# Patient Record
Sex: Female | Born: 1990 | Race: White | Hispanic: No | Marital: Single | Smoking: Never smoker
Health system: Southern US, Community
[De-identification: ages and names within clinical notes are randomized; demographics above are authoritative.]

## PROBLEM LIST (undated history)

## (undated) DIAGNOSIS — K219 Gastro-esophageal reflux disease without esophagitis: Secondary | ICD-10-CM

## (undated) DIAGNOSIS — J45909 Unspecified asthma, uncomplicated: Secondary | ICD-10-CM

---
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
## 2010-08-18 NOTE — Op Note (Signed)
[name], Teresa Bennett           ACCOUNT NO.:  [id number]   MEDICAL RECORD NO.:  [id number]          PATIENT TYPE:  AMB   LOCATION:  SDS                          FACILITY:  [facility]   PHYSICIAN:  Jon Gills, M.D.  DATE OF BIRTH:  Nov 28, 1990   DATE OF PROCEDURE:  DATE OF DISCHARGE:  08/08/2007                               OPERATIVE REPORT   PREOPERATIVE DIAGNOSIS:  Chest pain, undetermined cause.   POSTOPERATIVE DIAGNOSIS:  Chest pain, undetermined cause.   NAME OF OPERATION OR PROCEDURE:  Upper GI endoscopy with biopsy.   SURGEON:  Jon Gills, MD, Pediatric.   ASSISTANTS:  None.   DESCRIPTION OF FINDINGS:  Following informed and written consent, the  patient was taken to the operating room and placed under general  anesthesia with continuous cardiopulmonary monitoring.  She remained in  the supine position and the Pentax upper GI endoscope was passed by  mouth and advanced without difficulty.  A competent lower esophageal  sphincter was present 38 cm from the incisors.  There was no visual  evidence for gastritis, duodenitis, or peptic ulcer disease, although  linear erosions were seen in the distal esophagus.  A solitary gastric  biopsy was negative for Helicobacter by CLO testing.  Multiple  esophageal, gastric, and duodenal biopsies were histologically normal.  The endoscope was gradually withdrawn and the patient was taken to  recovery room in satisfactory condition.  She will be released later  today to the care of her family.   DESCRIPTION AND TECHNICAL PROCEDURE:  She used Pentax upper  gastrointestinal endoscope with cold biopsy forceps.   DESCRIPTION SPECIMENS REMOVED:  Esophagus x3 in formalin, gastric x1 for  CLO testing, gastric x3 in formalin, and duodenum x3 in formalin.           ______________________________  Jon Gills, M.D.     JHC/MEDQ  D:  08/28/2007  T:  08/29/2007  Job:  [id number]   cc:   Gildardo Pounds, M.D.

## 2011-03-24 IMAGING — CR CERVICAL SPINE - COMPLETE 4+ VIEW
1 series · 6 of 6 positions shown · non-contrast
Comparison: None

REASON FOR EXAM: LUE paresthesia s/p fall
COMMENTS:

PROCEDURE:     DXR - DXR CERVICAL SPINE COMPLETE  - September 20, 2009  [DATE]
RESULT:     History: Left upper extremity paresthesia

[Series 1: view not recorded · 0.17mm/px · 6 of 6 slices shown]
[im 1/6]
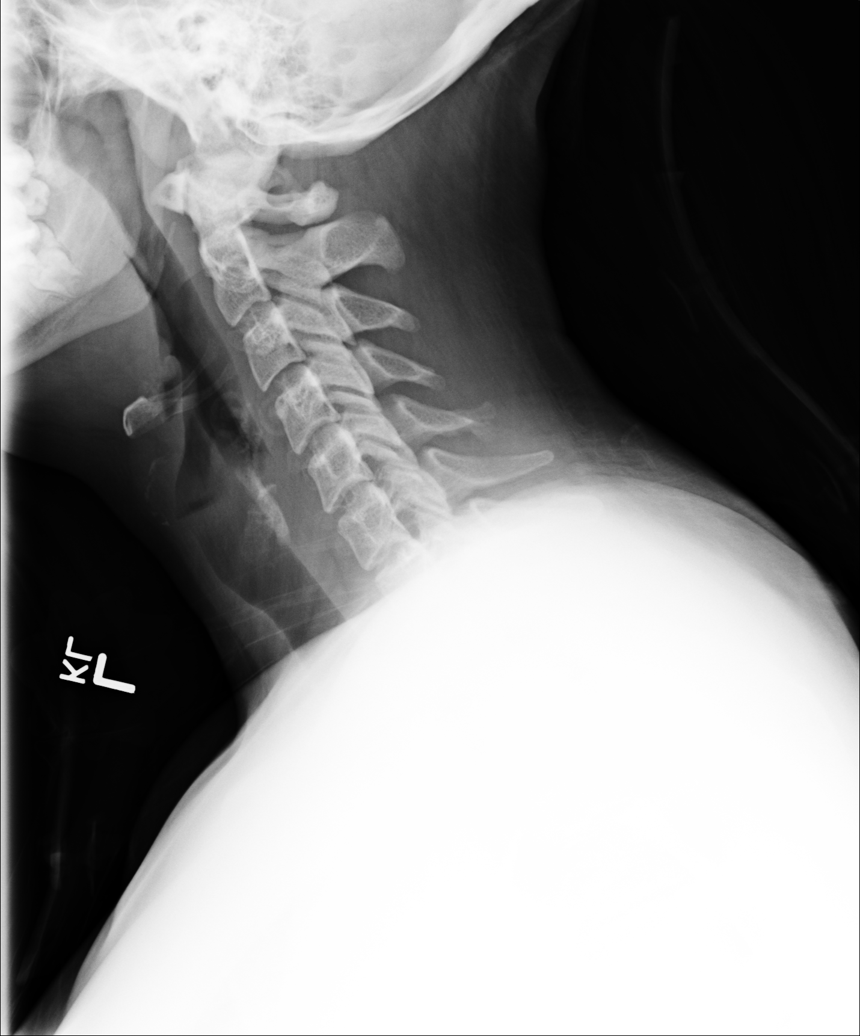
[im 2/6]
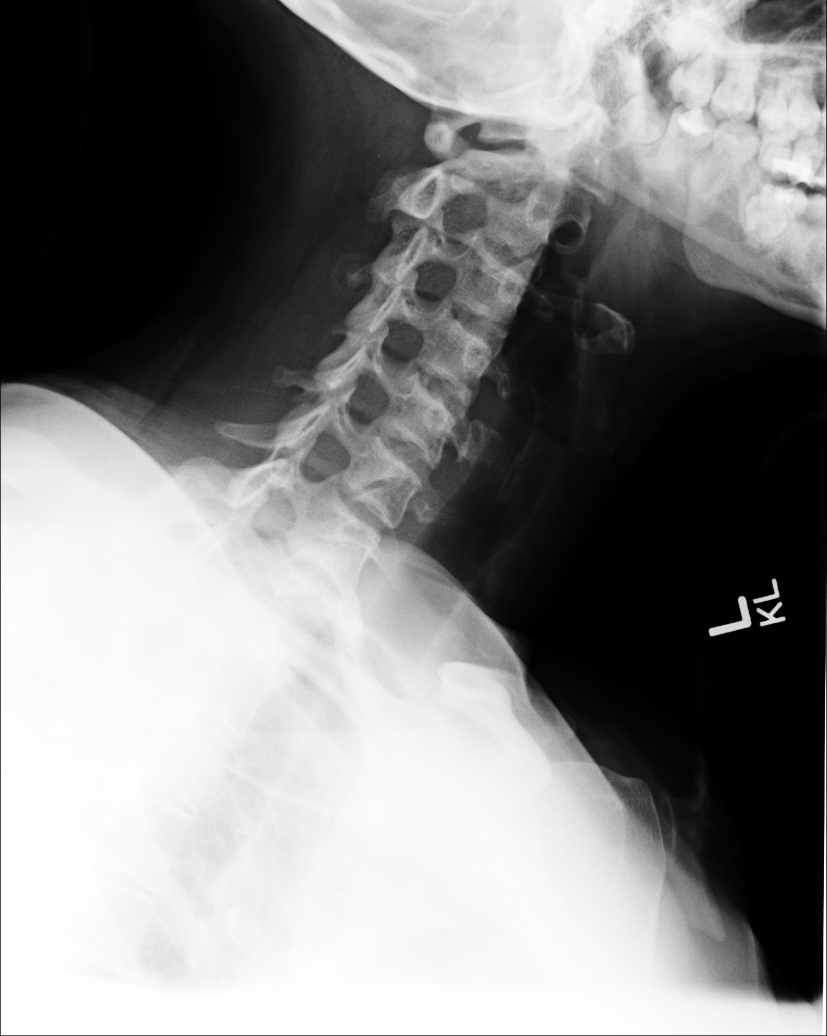
[im 3/6]
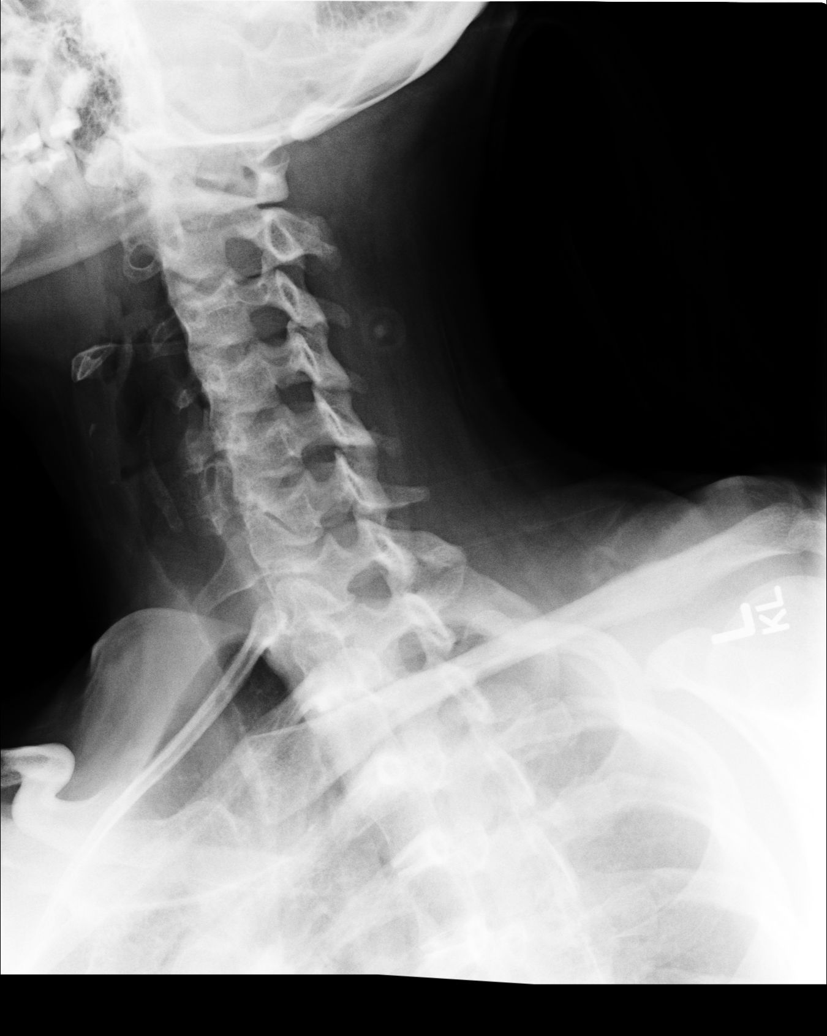
[im 4/6]
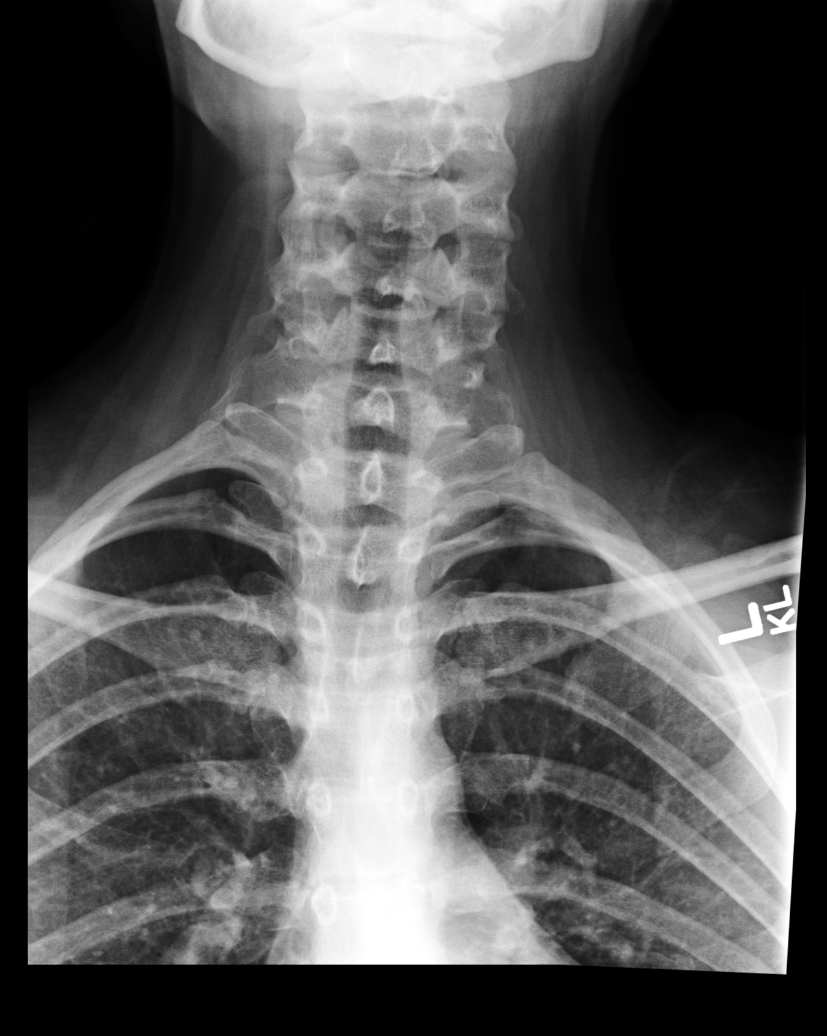
[im 5/6]
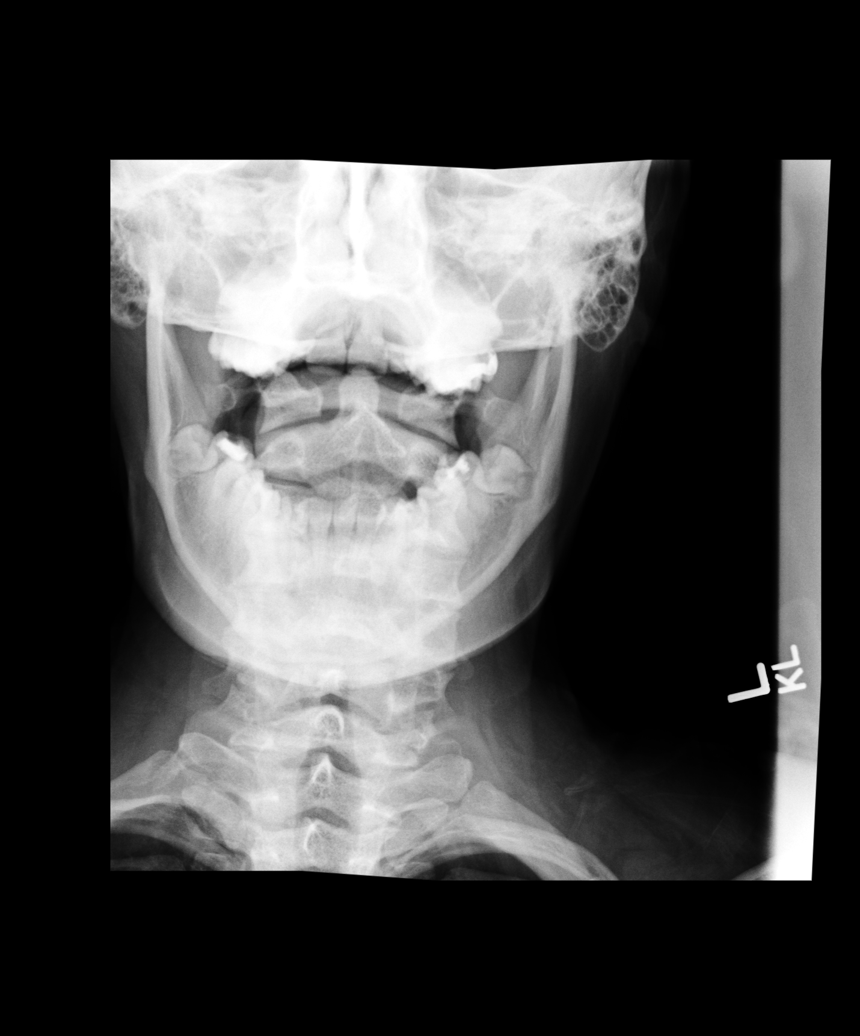
[im 6/6]
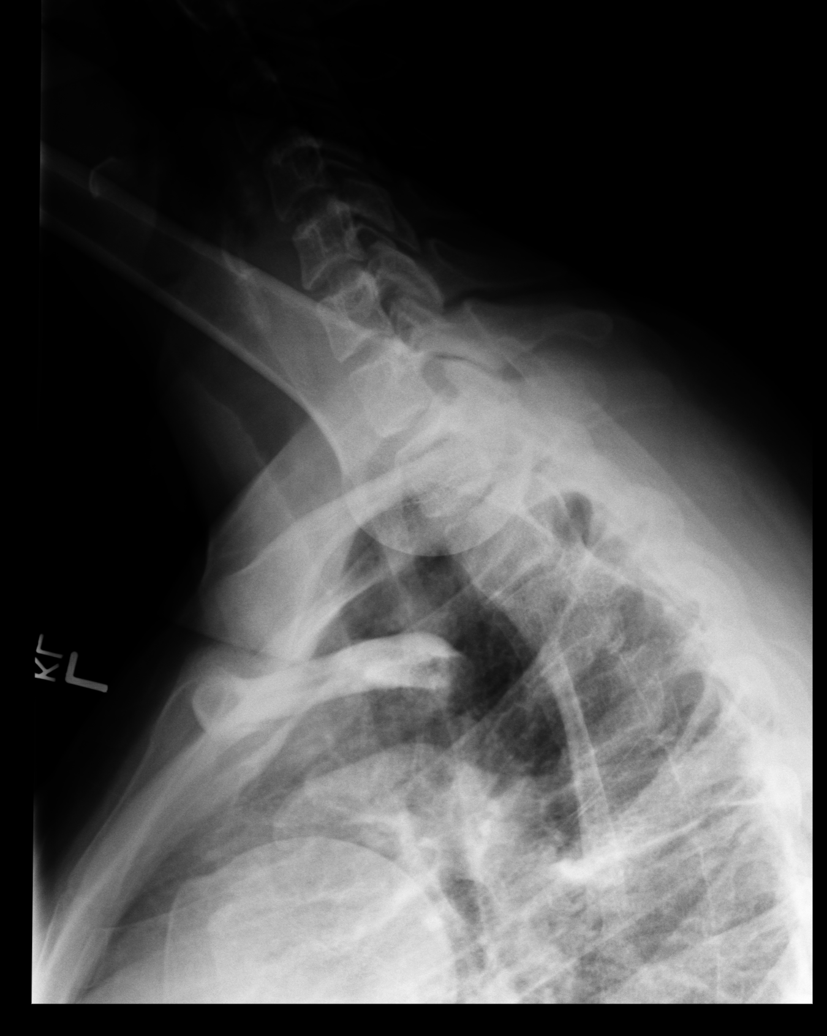

[6 of 6 positions shown; findings below may reference images not displayed]

FINDINGS: AP, lateral, odontoid and bilateral oblique views of the cervical spine are
provided.

The cervical spine is visualized to the level of C7-T1.

The vertebral body heights are maintained. The alignment is normal. The
prevertebral soft tissues are normal. There is no acute fracture or static
listhesis.
IMPRESSION: No acute osseous injury of the cervical spine.

## 2013-09-27 ENCOUNTER — Observation Stay: Payer: Self-pay | Admitting: Obstetrics and Gynecology

## 2013-09-27 LAB — URINALYSIS, COMPLETE
BLOOD: NEGATIVE
Bilirubin,UR: NEGATIVE
GLUCOSE, UR: NEGATIVE mg/dL (ref 0–75)
KETONE: NEGATIVE
LEUKOCYTE ESTERASE: NEGATIVE
NITRITE: NEGATIVE
PH: 6 (ref 4.5–8.0)
PROTEIN: NEGATIVE
RBC, UR: NONE SEEN /HPF (ref 0–5)
Specific Gravity: 1.012 (ref 1.003–1.030)
Squamous Epithelial: 2

## 2015-05-03 ENCOUNTER — Emergency Department
Admission: EM | Admit: 2015-05-03 | Discharge: 2015-05-03 | Disposition: A | Discharge status: Home / Self Care | Attending: Student | Admitting: Student

## 2015-05-03 ENCOUNTER — Encounter: Payer: Self-pay | Admitting: Emergency Medicine

## 2015-05-03 DIAGNOSIS — R109 Unspecified abdominal pain: Secondary | ICD-10-CM | POA: Diagnosis not present

## 2015-05-03 DIAGNOSIS — R112 Nausea with vomiting, unspecified: Secondary | ICD-10-CM | POA: Insufficient documentation

## 2015-05-03 DIAGNOSIS — R197 Diarrhea, unspecified: Secondary | ICD-10-CM | POA: Insufficient documentation

## 2015-05-03 DIAGNOSIS — Z3202 Encounter for pregnancy test, result negative: Secondary | ICD-10-CM | POA: Insufficient documentation

## 2015-05-03 DIAGNOSIS — R000 Tachycardia, unspecified: Secondary | ICD-10-CM | POA: Diagnosis not present

## 2015-05-03 DIAGNOSIS — R1110 Vomiting, unspecified: Secondary | ICD-10-CM

## 2015-05-03 HISTORY — DX: Gastro-esophageal reflux disease without esophagitis: K21.9

## 2015-05-03 LAB — CBC WITH DIFFERENTIAL/PLATELET
BASOS ABS: 0 10*3/uL (ref 0–0.1)
BASOS PCT: 0 %
EOS PCT: 0 %
Eosinophils Absolute: 0 10*3/uL (ref 0–0.7)
HEMATOCRIT: 48.3 % — AB (ref 35.0–47.0)
Hemoglobin: 16.5 g/dL — ABNORMAL HIGH (ref 12.0–16.0)
LYMPHS PCT: 5 %
Lymphs Abs: 0.4 10*3/uL — ABNORMAL LOW (ref 1.0–3.6)
MCH: 30 pg (ref 26.0–34.0)
MCHC: 34.2 g/dL (ref 32.0–36.0)
MCV: 87.8 fL (ref 80.0–100.0)
MONO ABS: 0.3 10*3/uL (ref 0.2–0.9)
Monocytes Relative: 3 %
NEUTROS ABS: 7.5 10*3/uL — AB (ref 1.4–6.5)
Neutrophils Relative %: 92 %
PLATELETS: 165 10*3/uL (ref 150–440)
RBC: 5.5 MIL/uL — AB (ref 3.80–5.20)
RDW: 13.1 % (ref 11.5–14.5)
WBC: 8.2 10*3/uL (ref 3.6–11.0)

## 2015-05-03 LAB — URINALYSIS COMPLETE WITH MICROSCOPIC (ARMC ONLY)
BACTERIA UA: NONE SEEN
BILIRUBIN URINE: NEGATIVE
Glucose, UA: NEGATIVE mg/dL
HGB URINE DIPSTICK: NEGATIVE
Ketones, ur: NEGATIVE mg/dL
LEUKOCYTES UA: NEGATIVE
Nitrite: NEGATIVE
Protein, ur: 30 mg/dL — AB
SPECIFIC GRAVITY, URINE: 1.031 — AB (ref 1.005–1.030)
pH: 6 (ref 5.0–8.0)

## 2015-05-03 LAB — COMPREHENSIVE METABOLIC PANEL
ALBUMIN: 5 g/dL (ref 3.5–5.0)
ALT: 34 U/L (ref 14–54)
AST: 22 U/L (ref 15–41)
Alkaline Phosphatase: 45 U/L (ref 38–126)
Anion gap: 9 (ref 5–15)
BUN: 24 mg/dL — AB (ref 6–20)
CHLORIDE: 103 mmol/L (ref 101–111)
CO2: 25 mmol/L (ref 22–32)
CREATININE: 0.84 mg/dL (ref 0.44–1.00)
Calcium: 8.7 mg/dL — ABNORMAL LOW (ref 8.9–10.3)
GFR calc Af Amer: >60 mL/min (ref >60)
GFR calc non Af Amer: >60 mL/min (ref >60)
GLUCOSE: 153 mg/dL — AB (ref 65–99)
POTASSIUM: 3.9 mmol/L (ref 3.5–5.1)
Sodium: 137 mmol/L (ref 135–145)
Total Bilirubin: 1 mg/dL (ref 0.3–1.2)
Total Protein: 8.2 g/dL — ABNORMAL HIGH (ref 6.5–8.1)

## 2015-05-03 LAB — LIPASE, BLOOD: Lipase: 19 U/L (ref 11–51)

## 2015-05-03 LAB — POCT PREGNANCY, URINE: Preg Test, Ur: NEGATIVE

## 2015-05-03 MED ORDER — ONDANSETRON HCL 4 MG/2ML IJ SOLN
4.0000 mg | Freq: Once | INTRAMUSCULAR | Status: AC
  Administered 2015-05-03: 4 mg via INTRAVENOUS
  Filled 2015-05-03: qty 2

## 2015-05-03 MED ORDER — ONDANSETRON 4 MG PO TBDP
4.0000 mg | ORAL_TABLET | Freq: Three times a day (TID) | ORAL | Status: DC | PRN
Start: 2015-05-03 — End: 2020-03-18

## 2015-05-03 MED ORDER — SODIUM CHLORIDE 0.9 % IV BOLUS (SEPSIS)
1000.0000 mL | Freq: Once | INTRAVENOUS | Status: AC
  Administered 2015-05-03: 1000 mL via INTRAVENOUS

## 2015-05-03 NOTE — ED Notes (Signed)
Pt to ed with c/o vomiting x 12 over the last 24 hours.  Pt reports one episode of diarrhea.

## 2015-05-03 NOTE — ED Notes (Signed)
Pt states vomiting for 2 days, states her 60 month old child was sick with a virus and she believes she got it from him, pt states she feels dehyrdrated and cramping, pt awake and alert upon arrival in no acute distress

## 2015-05-03 NOTE — ED Provider Notes (Signed)
[facility] Emergency Department Provider Note  ____________________________________________  Time seen: Approximately 7:30 AM  I have reviewed the triage vital signs and the nursing notes.   HISTORY  Chief Complaint Nausea and Emesis    HPI Teresa Bennett is a 25 y.o. female with history of GERD who presents for evaluation of one day gradual onset recurrent nonbloody nonbilious emesis as well as nonbloody diarrhea, gradual onset, constant since onset, currently moderate, no modifying factors. She reports that her small child has also been ill with vomiting and diarrhea. No fevers. She has had some abdominal cramping. The chest pain or difficulty breathing.   Past Medical History  Diagnosis Date  . GERD (gastroesophageal reflux disease)     There are no active problems to display for this patient.   History reviewed. No pertinent past surgical history.  Current Outpatient Rx  Name  Route  Sig  Dispense  Refill  . ondansetron (ZOFRAN ODT) 4 MG disintegrating tablet   Oral   Take 1 tablet (4 mg total) by mouth every 8 (eight) hours as needed for nausea or vomiting.   12 tablet   0     Allergies Prevacid  History reviewed. No pertinent family history.  Social History Social History  Substance Use Topics  . Smoking status: Never Smoker   . Smokeless tobacco: None  . Alcohol Use: Yes    Review of Systems Constitutional: No fever/chills Eyes: No visual changes. ENT: No sore throat. Cardiovascular: Denies chest pain. Respiratory: Denies shortness of breath. Gastrointestinal: +abdominal cramping. +Nausea, + vomiting.  + diarrhea.  No constipation. Genitourinary: Negative for dysuria. Musculoskeletal: Negative for back pain. Skin: Negative for rash. Neurological: Negative for headaches, focal weakness or numbness.  10-point ROS otherwise negative.  ____________________________________________   PHYSICAL EXAM:  VITAL SIGNS: ED  Triage Vitals  Enc Vitals Group     BP 05/03/15 0716 114/69 mmHg     Pulse Rate 05/03/15 0716 107     Resp 05/03/15 0716 18     Temp 05/03/15 0716 98.5 F (36.9 C)     Temp src --      SpO2 05/03/15 0716 96 %     Weight 05/03/15 0716 200 lb (90.719 kg)     Height 05/03/15 0716  (1.778 m)     Head Cir --      Peak Flow --      Pain Score 05/03/15 0720 4     Pain Loc --      Pain Edu? --      Excl. in GC? --     Constitutional: Alert and oriented. Well appearing and in no acute distress. Eyes: Conjunctivae are normal. PERRL. EOMI. Head: Atraumatic. Nose: No congestion/rhinnorhea. Mouth/Throat: Mucous membranes are moist.  Oropharynx non-erythematous. Neck: No stridor.   Cardiovascular: mildly tachycardic rate, regular rhythm. Grossly normal heart sounds.  Good peripheral circulation. Respiratory: Normal respiratory effort.  No retractions. Lungs CTAB. Gastrointestinal: Soft and nontender. No distention. No CVA tenderness. Genitourinary: deferred Musculoskeletal: No lower extremity tenderness nor edema.  No joint effusions. Neurologic:  Normal speech and language. No gross focal neurologic deficits are appreciated. No gait instability. Skin:  Skin is warm, dry and intact. No rash noted. Psychiatric: Mood and affect are normal. Speech and behavior are normal.  ____________________________________________   LABS (all labs ordered are listed, but only abnormal results are displayed)  Labs Reviewed  CBC WITH DIFFERENTIAL/PLATELET - Abnormal; Notable for the following:    RBC 5.50 (*)  Hemoglobin 16.5 (*)    HCT 48.3 (*)    Neutro Abs 7.5 (*)    Lymphs Abs 0.4 (*)    All other components within normal limits  COMPREHENSIVE METABOLIC PANEL - Abnormal; Notable for the following:    Glucose, Bld 153 (*)    BUN 24 (*)    Calcium 8.7 (*)    Total Protein 8.2 (*)    All other components within normal limits  URINALYSIS COMPLETEWITH MICROSCOPIC ([facility] ONLY) - Abnormal;  Notable for the following:    Color, Urine YELLOW (*)    APPearance CLEAR (*)    Specific Gravity, Urine 1.031 (*)    Protein, ur 30 (*)    Squamous Epithelial / LPF 0-5 (*)    All other components within normal limits  LIPASE, BLOOD  POC URINE PREG, ED  POCT PREGNANCY, URINE   ____________________________________________  EKG  none ____________________________________________  RADIOLOGY  none ____________________________________________   PROCEDURES  Procedure(s) performed: None  Critical Care performed: No  ____________________________________________   INITIAL IMPRESSION / ASSESSMENT AND PLAN / ED COURSE  Pertinent labs & imaging results that were available during my care of the patient were reviewed by me and considered in my medical decision making (see chart for details).  Teresa Bennett is a 25 y.o. female with history of GERD who presents for evaluation of one day gradual onset recurrent nonbloody nonbilious emesis as well as nonbloody diarrhea. On exam, she is generally well-appearing and in no acute distress. Mildly tachycardic on arrival however the remainder of her vital signs are stable, she is afebrile. She has a benign abdominal examination. Suspect viral syndrome given sick contacts with same. Plan for screening labs, we'll give IV fluids, antiemetics.   ----------------------------------------- 9:54 AM on 05/03/2015 ----------------------------------------- Labs reviewed and are notable for mild elevation in hemoglobin which I suspect is secondary to hemoconcentration, fluids were given. CMP generally unremarkable, normal lipase. Urine pregnancy test is negative. Urinalysis is not consistent with infection. The patient is tolerating by mouth intake. Tachycardia resolved. DC with return precautions and PCP follow-up. She is comfortable with the discharge plan.  ____________________________________________   FINAL CLINICAL IMPRESSION(S) / ED  DIAGNOSES  Final diagnoses:  Vomiting and diarrhea      Gayla Doss, MD 05/03/15 [phone]

## 2017-07-22 ENCOUNTER — Encounter: Payer: Self-pay | Admitting: Obstetrics and Gynecology

## 2017-11-27 ENCOUNTER — Encounter

## 2017-11-28 ENCOUNTER — Encounter: Payer: Self-pay | Admitting: Obstetrics and Gynecology

## 2017-11-28 ENCOUNTER — Ambulatory Visit (INDEPENDENT_AMBULATORY_CARE_PROVIDER_SITE_OTHER): Payer: BLUE CROSS/BLUE SHIELD | Admitting: Obstetrics and Gynecology

## 2017-11-28 ENCOUNTER — Encounter

## 2017-11-28 ENCOUNTER — Other Ambulatory Visit (HOSPITAL_COMMUNITY)
Admission: RE | Admit: 2017-11-28 | Discharge: 2017-11-28 | Disposition: A | Discharge status: Home / Self Care | Payer: BLUE CROSS/BLUE SHIELD | Source: Ambulatory Visit | Attending: Obstetrics and Gynecology | Admitting: Obstetrics and Gynecology

## 2017-11-28 VITALS — BP 118/77 | HR 75 | Ht 70.0 in | Wt 226.6 lb

## 2017-11-28 DIAGNOSIS — Z01419 Encounter for gynecological examination (general) (routine) without abnormal findings: Secondary | ICD-10-CM

## 2017-11-28 NOTE — Progress Notes (Signed)
  Subjective:     Teresa Bennett is a married white 27 y.o. female and is here for a comprehensive physical exam.G2P2 with 2 c/s deliveries. FT homemaker.  spouse had a vasectomy. The patient reports no problems.  Social History   Socioeconomic History  . Marital status: Single    Spouse name: Not on file  . Number of children: Not on file  . Years of education: Not on file  . Highest education level: Not on file  Occupational History  . Not on file  Social Needs  . Financial resource strain: Not on file  . Food insecurity:    Worry: Not on file    Inability: Not on file  . Transportation needs:    Medical: Not on file    Non-medical: Not on file  Tobacco Use  . Smoking status: Never Smoker  . Smokeless tobacco: Never Used  Substance and Sexual Activity  . Alcohol use: Yes  . Drug use: Never  . Sexual activity: Yes    Comment: husband vasectomy  Lifestyle  . Physical activity:    Days per week: Not on file    Minutes per session: Not on file  . Stress: Not on file  Relationships  . Social connections:    Talks on phone: Not on file    Gets together: Not on file    Attends religious service: Not on file    Active member of club or organization: Not on file    Attends meetings of clubs or organizations: Not on file    Relationship status: Not on file  . Intimate partner violence:    Fear of current or ex partner: Not on file    Emotionally abused: Not on file    Physically abused: Not on file    Forced sexual activity: Not on file  Other Topics Concern  . Not on file  Social History Narrative  . Not on file   Health Maintenance  Topic Date Due  . HIV Screening  07/30/2005  . TETANUS/TDAP  07/30/2009  . PAP SMEAR  07/31/2011  . INFLUENZA VACCINE  10/31/2017    The following portions of the patient's history were reviewed and updated as appropriate: allergies, current medications, past family history, past medical history, past social history, past surgical  history and problem list.  Review of Systems A comprehensive review of systems was negative.   Objective:    General appearance: alert, cooperative and appears stated age Neck: no adenopathy, no carotid bruit, no JVD and supple, symmetrical, trachea midline Lungs: clear to auscultation bilaterally Breasts: normal appearance, no masses or tenderness Heart: regular rate and rhythm, S1, S2 normal, no murmur, click, rub or gallop Abdomen: soft, non-tender; bowel sounds normal; no masses,  no organomegaly Pelvic: cervix normal in appearance, external genitalia normal, no adnexal masses or tenderness, no cervical motion tenderness, rectovaginal septum normal, uterus normal size, shape, and consistency and vagina normal without discharge    Assessment:    Healthy female exam.  Obesity Anxiety       Plan:  Declined medication for anxiety as she is still breast feeding. Encouraged diet modification and exercise for weight loss. RTC in 1 year or as needed.    See After Visit Summary for Counseling Recommendations

## 2017-11-28 NOTE — Patient Instructions (Signed)
  Place annual gynecologic exam patient instructions here.  Thank you for enrolling in MyChart. Please follow the instructions below to securely access your online medical record. MyChart allows you to send messages to your doctor, view your test results, manage appointments, and more.   How Do I Sign Up? 1. In your Internet browser, go to [employer] Address [employer] and enter [email or web address]. 2. Click on the Sign Up Now link in the Sign In box. You will see the New Member Sign Up page. 3. Enter your MyChart Access Code exactly as it appears below. You will not need to use this code after you've completed the sign-up process. If you do not sign up before the expiration date, you must request a new code.  MyChart Access Code: [email or web address] Expires: 01/12/2018  2:13 PM  4. Enter your Social Security Number ([id number]) and Date of Birth (mm/dd/yyyy) as indicated and click Submit. You will be taken to the next sign-up page. 5. Create a MyChart ID. This will be your MyChart login ID and cannot be changed, so think of one that is secure and easy to remember. 6. Create a MyChart password. You can change your password at any time. 7. Enter your Password Reset Question and Answer. This can be used at a later time if you forget your password.  8. Enter your e-mail address. You will receive e-mail notification when new information is available in MyChart. 9. Click Sign Up. You can now view your medical record.   Additional Information Remember, MyChart is NOT to be used for urgent needs. For medical emergencies, dial 911.

## 2017-11-29 ENCOUNTER — Telehealth: Payer: Self-pay | Admitting: *Deleted

## 2017-11-29 LAB — COMPREHENSIVE METABOLIC PANEL
ALK PHOS: 40 IU/L (ref 39–117)
ALT: 30 IU/L (ref 0–32)
AST: 21 IU/L (ref 0–40)
Albumin/Globulin Ratio: 2 (ref 1.2–2.2)
Albumin: 4.7 g/dL (ref 3.5–5.5)
BILIRUBIN TOTAL: 0.5 mg/dL (ref 0.0–1.2)
BUN / CREAT RATIO: 22 (ref 9–23)
BUN: 19 mg/dL (ref 6–20)
CHLORIDE: 106 mmol/L (ref 96–106)
CO2: 22 mmol/L (ref 20–29)
Calcium: 9.6 mg/dL (ref 8.7–10.2)
Creatinine, Ser: 0.85 mg/dL (ref 0.57–1.00)
GFR calc Af Amer: 109 mL/min/{1.73_m2} (ref >59)
GFR calc non Af Amer: 94 mL/min/{1.73_m2} (ref >59)
GLUCOSE: 83 mg/dL (ref 65–99)
Globulin, Total: 2.4 g/dL (ref 1.5–4.5)
Potassium: 4 mmol/L (ref 3.5–5.2)
Sodium: 142 mmol/L (ref 134–144)
Total Protein: 7.1 g/dL (ref 6.0–8.5)

## 2017-11-29 LAB — THYROID PANEL WITH TSH
Free Thyroxine Index: 1.7 (ref 1.2–4.9)
T3 Uptake Ratio: 25 % (ref 24–39)
T4, Total: 6.8 ug/dL (ref 4.5–12.0)
TSH: 1.35 u[IU]/mL (ref 0.450–4.500)

## 2017-11-29 LAB — CYTOLOGY - PAP
Chlamydia: NEGATIVE
DIAGNOSIS: NEGATIVE
NEISSERIA GONORRHEA: NEGATIVE

## 2017-11-29 LAB — LIPID PANEL
CHOL/HDL RATIO: 2.1 ratio (ref 0.0–4.4)
CHOLESTEROL TOTAL: 109 mg/dL (ref 100–199)
HDL: 51 mg/dL (ref >39)
LDL CALC: 45 mg/dL (ref 0–99)
TRIGLYCERIDES: 63 mg/dL (ref 0–149)
VLDL Cholesterol Cal: 13 mg/dL (ref 5–40)

## 2017-11-29 LAB — HEMOGLOBIN A1C
Est. average glucose Bld gHb Est-mCnc: 108 mg/dL
Hgb A1c MFr Bld: 5.4 % (ref 4.8–5.6)

## 2017-11-29 NOTE — Telephone Encounter (Signed)
Mailed info to pt 

## 2017-11-29 NOTE — Telephone Encounter (Signed)
-----   Message from Purcell NailsMelody N Shambley, [place] sent at 11/29/2017  8:23 AM EDT ----- Please let her know all labs are normal

## 2017-12-04 ENCOUNTER — Telehealth: Payer: Self-pay | Admitting: *Deleted

## 2017-12-04 NOTE — Telephone Encounter (Signed)
Notified pt of results 

## 2017-12-04 NOTE — Telephone Encounter (Signed)
-----   Message from Purcell Nails, [place] sent at 12/04/2017  9:25 AM EDT ----- Pap and std screen are negative. Please remind her to sign on to MyChart

## 2018-05-21 ENCOUNTER — Ambulatory Visit: Payer: BLUE CROSS/BLUE SHIELD | Attending: Family Medicine | Admitting: Physical Therapy

## 2018-05-26 ENCOUNTER — Ambulatory Visit: Payer: BLUE CROSS/BLUE SHIELD | Admitting: Physical Therapy

## 2018-05-29 ENCOUNTER — Ambulatory Visit: Payer: BLUE CROSS/BLUE SHIELD | Admitting: Physical Therapy

## 2018-06-02 ENCOUNTER — Ambulatory Visit: Payer: BLUE CROSS/BLUE SHIELD | Attending: Family Medicine | Admitting: Physical Therapy

## 2018-06-02 ENCOUNTER — Encounter: Payer: Self-pay | Admitting: Physical Therapy

## 2018-06-02 DIAGNOSIS — M26622 Arthralgia of left temporomandibular joint: Secondary | ICD-10-CM

## 2018-06-02 NOTE — Therapy (Signed)
[facility] PHYSICAL AND SPORTS MEDICINE [address] [place], [address] Phone: [phone]   Fax:  [phone]  Physical Therapy Evaluation  Patient Details  Name: Teresa Bennett MRN: [id number] Date of Birth: 05-07-1990 Referring Provider (PT): Joanie Coddington PA   Encounter Date: 06/02/2018    Past Medical History:  Diagnosis Date  . GERD (gastroesophageal reflux disease)     Past Surgical History:  Procedure Laterality Date  . CESAREAN SECTION      There were no vitals filed for this visit.      OBJECTIVE  Mental Status Patient is oriented to person, place and time.  Recent memory is intact.  Remote memory is intact.  Attention span and concentration are intact.  Expressive speech is intact.  Patient's fund of knowledge is within normal limits for educational level.  SENSATION: Grossly intact to light touch bilateral UE as determined by testing dermatomes C2-T2 Proprioception and hot/cold testing deferred on this date   MUSCULOSKELETAL: Tremor: None Bulk: Normal Tone: Normal  Posture Rounded shoulders w/ thoracic kyphosis, severe forward head with upper cervical ext  Gait Gait assessment deferred on this date    Palpation  TTP at L masseter, and temporalis with noted tension  Strength Cervical isometrics are strong in all directions; 5/5 bilatMandible lateral flexion 5/5 Mandible Retraction 5/5 Mandible Protraction 5/5 Mandible opening  AROM R/L wnl Cervical Flexion wnl Cervical Extension 40d/ 44d Cervical Lateral Flexion wnl Cervical Rotation wnl Mandible lateral flexion wnl Mandible Retraction wnl Mandible Protraction 4.5 cm Mandible opening *Indicates pain, overpressure performed unless otherwise indicated  PROM R/L 50 Cervical Flexion 80 Cervical Extension 45/45 Cervical Lateral Flexion 85/85 Cervical Rotation All mandible motions wnl  *Indicates pain, overpressure performed unless  otherwise indicated  Repeated Movements No centralization or peripheralization of symptoms with repeated cervical protraction and retraction.    Passive Accessory Intervertebral Motion (PAIVM) Pt denies reproduction of neck pain with CPA C2-T7 and UPA bilaterally C1-T7. Generally hypomobile throughout  Passive Physiological Intervertebral Motion (PPIVM) Normal flexion and extension with PPIVM testing  SPECIAL TESTS Spurlings A (ipsilateral lateral flexion/axial compression): R: {NEGATIVE/POSITIVE F Spurlings B (ipsilateral lateral flexion/contralateral rotation/axial compression): Negative bilat Distraction Test: Negative   Therex Education and printout of Rocabado 6x6x6  Education on posture and this effect on TMJ mechanics with patient able to demonstrate with accuracy following                     Objective measurements completed on examination: See above findings.                PT Short Term Goals - 06/11/18 1145      PT SHORT TERM GOAL #1   Title  Pt will be independent with HEP in order to improve strength and decrease back pain in order to improve pain-free function at home and work.     Time  4    Period  Weeks    Status  New        PT Long Term Goals - 06/11/18 1146      PT LONG TERM GOAL #1   Title  Patient will increase FOTO score to 80 to demonstrate predicted increase in functional mobility to complete ADLs    Baseline  06/02/18 69    Time  8    Period  Weeks    Status  New      PT LONG TERM GOAL #2   Title  Pt  will decrease worst neck pain as reported on NPRS by at least 2 points in order to demonstrate clinically significant reduction in back pain.    Baseline  06/02/18 5/10    Time  8    Period  Weeks    Status  New      PT LONG TERM GOAL #3   Title  Patient will demonstrate normal mouth openning (10cm) in order to be able to eat and brush teeth     Baseline  06/02/18 4.5cm    Time  8    Period  Weeks    Status  New       PT LONG TERM GOAL #4   Title  Pt will demonstrate normal TMJ openning and closing without deviations to demonstrate proper mechanics for chewing    Baseline  3/2/2- L lateral deviation with closing    Time  8    Period  Weeks    Status  New             Plan - 06/11/18 1552    Clinical Impression Statement  Patient is a 28 year 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 Patient with impairments in TMJ pain, sleep dysfunction, decreased openning, and mechanical abnormalities. 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 Would benefit from skilled PT to address above deficits and promote optimal return to PLOF    Personal Factors and Comorbidities  Comorbidity 1;Past/Current Experience;Fitness;Time since onset of injury/illness/exacerbation    Examination-Activity Limitations  Sleep;Caring for Others;Self Feeding    Examination-Participation Restrictions  Cleaning;Interpersonal Relationship;Community Activity    Stability/Clinical Decision Making  Evolving/Moderate complexity    Clinical Decision Making  Moderate    Rehab Potential  Good    PT Frequency  2x / week    PT Duration  8 weeks    PT Treatment/Interventions  ADLs/Self Care Home Management;Electrical Stimulation;Moist Heat;Traction;Iontophoresis 4mg /ml Dexamethasone;Therapeutic exercise;Passive range of motion;Other (comment);Manual techniques;Neuromuscular re-education;Therapeutic activities;Patient/family education   Trigger point dry needling   PT Next Visit Plan  Continued TDN possible lateral pteryogoid, posture training    PT Home Exercise Plan  Rocabado 6x6x6    Consulted and Agree with Plan of Care  Patient       Patient will benefit from skilled therapeutic intervention in order to improve the following deficits and impairments:  Decreased activity  tolerance, Decreased endurance, Decreased range of motion, Hypomobility, Increased fascial restricitons, Improper body mechanics, Pain, Postural dysfunction, Impaired flexibility, Increased muscle spasms, Decreased mobility, Decreased coordination  Visit Diagnosis: Arthralgia of left temporomandibular joint     Problem List There are no active problems to display for this patient.   Staci Acosta 06/11/2018, 4:57 PM  [facility] PH[facility]ICAL AND SPORTS MEDICINE [address] [place], [address] Phone: [phone]   Fax:  [phone]  Name: Teresa Bennett MRN: [id number] Date of Birth: 1990/05/16

## 2018-06-04 ENCOUNTER — Ambulatory Visit: Payer: BLUE CROSS/BLUE SHIELD | Admitting: Physical Therapy

## 2018-06-09 ENCOUNTER — Encounter: Payer: BLUE CROSS/BLUE SHIELD | Admitting: Physical Therapy

## 2018-06-11 ENCOUNTER — Ambulatory Visit: Payer: BLUE CROSS/BLUE SHIELD | Admitting: Physical Therapy

## 2018-06-11 ENCOUNTER — Other Ambulatory Visit: Payer: Self-pay

## 2018-06-11 ENCOUNTER — Encounter: Payer: Self-pay | Admitting: Physical Therapy

## 2018-06-11 DIAGNOSIS — M26622 Arthralgia of left temporomandibular joint: Secondary | ICD-10-CM

## 2018-06-11 NOTE — Therapy (Signed)
[facility]ICINE [address], [place], [address] Phone: [phone]   Fax:  [phone]  Physical Therapy Treatment  Patient Details  Name: Teresa Bennett MRN: [id number] Date of Birth: 1990/10/08 Referring Provider (PT): Joanie Coddington PA   Encounter Date: 06/11/2018  PT End of Session - 06/11/18 1125    Visit Number  2    Number of Visits  17    Date for PT Re-Evaluation  07/14/18    PT Start Time  1115    PT Stop Time  1200    PT Time Calculation (min)  45 min    Activity Tolerance  Patient tolerated treatment well    Behavior During Therapy  [facility] for tasks assessed/performed       Past Medical History:  Diagnosis Date  . GERD (gastroesophageal reflux disease)     Past Surgical History:  Procedure Laterality Date  . CESAREAN SECTION      There were no vitals filed for this visit.  Subjective Assessment - 06/11/18 1123    Subjective  Patient reports she is feeling "about the same". Reports some compliance with HEP. Patient reports 3/10 pain today wrapping around the head to forehead and directly in the jaw.     Pertinent History  Patient is a 28 year old female that reports L jaw/head/neck pain over past 8 months after having tooth extraction and crown placement. Patient reports pain is in jaw muscle, that radiates to temple, behind the eye and the L UT. Patient reports pain is "crampy" and mostly a "dull ache". 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 Patient reports she is unable to play with her children sometimes because her pain is severe, reports pain with chewing, especially steak. Patient reports she has stopped chewing gum and wearing a mouth gaurd and has not seen a big change from this. Reports pain is worse with stress. Worst pain over the past week 5/10; best 0/10. Pt denies N/V, B&B changes, unexplained weight fluctuation, saddle paresthesia, fever, night sweats, or unrelenting  night pain at this time.    Limitations  House hold activities;Lifting    How long can you sit comfortably?  unlimited    How long can you stand comfortably?  unlimited    How long can you walk comfortably?  unlimited    Diagnostic tests  none    Patient Stated Goals  Decrease pain    Pain Onset  More than a month ago           Manual - STM with trigger point release to L masseter and temporalis, following Dry Needling: (2/2) 2mm .25 needles placed along the L masseter and temporalis to decrease increased muscular spasms and trigger points with the patient positioned in supine. Patient was educated on risks and benefits of therapy and verbally consents to PT.  - Cervical traction 10x 10sec traction 10sec release   Ther-Ex - Jaw openning with tongue on roof of mouth x10 - Cervical retraction x10 with TC of finger on chin - Standing rows 15# 3x 10 with demo and max cuing initially for proper posture with fair carry over following - Cervical retraction in supine x10; chin retraction with 1in lift x10; chin retraction with 1in lift and 5 sec holds x10 - Supine thoracic ext on foam roll with maintained cervical retraction 2x 10 with cuing for cervical posture with good carry over following  PT Education - 06/11/18 1125    Education Details  TDN education    Person(s) Educated  Patient    Methods  Explanation;Verbal cues    Comprehension  Verbalized understanding;Verbal cues required       PT Short Term Goals - 06/11/18 1145      PT SHORT TERM GOAL #1   Title  Pt will be independent with HEP in order to improve strength and decrease back pain in order to improve pain-free function at home and work.     Time  4    Period  Weeks    Status  New        PT Long Term Goals - 06/11/18 1146      PT LONG TERM GOAL #1   Title  Patient will increase FOTO score to 80 to demonstrate predicted increase in functional mobility to complete ADLs    Baseline   06/02/18 69    Time  8    Period  Weeks    Status  New      PT LONG TERM GOAL #2   Title  Pt will decrease worst neck pain as reported on NPRS by at least 2 points in order to demonstrate clinically significant reduction in back pain.    Baseline  06/02/18 5/10    Time  8    Period  Weeks    Status  New      PT LONG TERM GOAL #3   Title  Patient will demonstrate normal mouth openning (10cm) in order to be able to eat and brush teeth     Baseline  06/02/18 4.5cm    Time  8    Period  Weeks    Status  New      PT LONG TERM GOAL #4   Title  Pt will demonstrate normal TMJ openning and closing without deviations to demonstrate proper mechanics for chewing    Baseline  3/2/2- L lateral deviation with closing    Time  8    Period  Weeks    Status  New            Plan - 06/11/18 1447    Clinical Impression Statement  Patient reports decreased tension following TDN, with increased openning to wnl with continued slight L lateral deviation with closing. Patietn has difficulty with cervical neutral posture, with cuing needed for proper with therex and to achieve proper posture. No increased pain with postural therex, only noted muscle fatigue.     PT Next Visit Plan  Continued TDN possible lateral pteryogoid, posture training    PT Home Exercise Plan  Rocabado 6x6x6    Consulted and Agree with Plan of Care  Patient       Patient will benefit from skilled therapeutic intervention in order to improve the following deficits and impairments:     Visit Diagnosis: Arthralgia of left temporomandibular joint     Problem List There are no active problems to display for this patient.  Staci Acosta PT, DPT Staci Acosta 06/11/2018, 2:49 PM  [facility] PHYSICAL AND SPORTS MEDICINE [address], [place], [address] Phone: [phone]   Fax:  [phone]  Name: Teresa Bennett MRN: [id number] Date of Birth: 07-15-1990

## 2018-06-12 ENCOUNTER — Other Ambulatory Visit: Payer: Self-pay

## 2018-06-13 ENCOUNTER — Encounter: Payer: BLUE CROSS/BLUE SHIELD | Admitting: Physical Therapy

## 2018-06-16 ENCOUNTER — Ambulatory Visit: Payer: BLUE CROSS/BLUE SHIELD | Admitting: Physical Therapy

## 2018-06-18 ENCOUNTER — Ambulatory Visit: Payer: BLUE CROSS/BLUE SHIELD | Admitting: Physical Therapy

## 2018-06-18 ENCOUNTER — Other Ambulatory Visit: Payer: Self-pay

## 2018-06-18 ENCOUNTER — Encounter: Payer: Self-pay | Admitting: Physical Therapy

## 2018-06-18 DIAGNOSIS — M26622 Arthralgia of left temporomandibular joint: Secondary | ICD-10-CM | POA: Diagnosis not present

## 2018-06-18 NOTE — Therapy (Signed)
[facility] [address], [place], [address] Phone: [phone]   Fax:  [phone]  Physical Therapy Treatment  Patient Details  Name: Teresa Bennett MRN: [id number] Date of Birth: [birth year] Referring Provider (PT): Joanie Coddington PA   Encounter Date: 06/18/2018  PT End of Session - 06/18/18 1007    Visit Number  3    Number of Visits  17    Date for PT Re-Evaluation  07/14/18    PT Start Time  0945    PT Stop Time  1030    PT Time Calculation (min)  45 min    Activity Tolerance  Patient tolerated treatment well    Behavior During Therapy  [facility] for tasks assessed/performed       Past Medical History:  Diagnosis Date  . GERD (gastroesophageal reflux disease)     Past Surgical History:  Procedure Laterality Date  . CESAREAN SECTION      There were no vitals filed for this visit.  Subjective Assessment - 06/18/18 0951    Subjective  Patient reports she was a little sore after TDN, but feels as though her jaw is looser. Patient reports 4/10 pain that she reports has gotten worse since being stressed being home with family and worried about COVID-19. Patient reports she has been at home with both children, which ahs been difficult. Patient continues to report headaches that have "eased up a little" and is mostly localized right at the temple.     Pertinent History  Patient is a 28 year old female that reports L jaw/head/neck pain over past 8 months after having tooth extraction and crown placement. Patient reports pain is in jaw muscle, that radiates to temple, behind the eye and the L UT. Patient reports pain is "crampy" and mostly a "dull ache". 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 Patient reports she is unable to play with her children sometimes because her pain is severe, reports pain with chewing, especially steak. Patient reports she has stopped chewing gum and wearing a mouth gaurd  and has not seen a big change from this. Reports pain is worse with stress. Worst pain over the past week 5/10; best 0/10. Pt denies N/V, B&B changes, unexplained weight fluctuation, saddle paresthesia, fever, night sweats, or unrelenting night pain at this time.    Limitations  House hold activities;Lifting    How long can you sit comfortably?  unlimited    How long can you stand comfortably?  unlimited    How long can you walk comfortably?  unlimited    Pain Onset  More than a month ago       Manual - STM with trigger point release to L masseter, pterygoids, and temporalis, following Dry Needling: (2/2) 30mm .25 needles placed along the L masseter, lateral pterygoid and temporalis to decrease increased muscular spasms and trigger points with the patient positioned in supine. Patient was educated on risks and benefits of therapy and verbally consents to PT.     Ther-Ex - Jaw openning with tongue on roof of mouth x10 - Cervical retraction in supine x10; chin retraction with 1in lift x10; chin retraction with 1in lift and 5 sec holds x10 - Doorway pec stretch 2x 30sec hold with demo prior - Standing rows 15# 3x 10 with max TC initially to prevent shoulder hiking and wrist neutral with good carry over following. Patient has increased difficulty with  stability d/t hypermobility - Standing low rows BTB 3x 10 with demo and max cuing initially for proper scapular activation without shoulder hiking; good carry over following - Lat pulldown 25# 3x 10 with max TC following demo to maintain cervical neutral with periscapular activation without shoulder hiking- good carry over following                        PT Education - 06/18/18 1006    Education Details  TDN and exercise form    Person(s) Educated  Patient    Methods  Explanation;Demonstration;Verbal cues    Comprehension  Verbalized understanding;Returned demonstration;Verbal cues required       PT Short Term Goals -  06/11/18 1145      PT SHORT TERM GOAL #1   Title  Pt will be independent with HEP in order to improve strength and decrease back pain in order to improve pain-free function at home and work.     Time  4    Period  Weeks    Status  New        PT Long Term Goals - 06/11/18 1146      PT LONG TERM GOAL #1   Title  Patient will increase FOTO score to 80 to demonstrate predicted increase in functional mobility to complete ADLs    Baseline  06/02/18 69    Time  8    Period  Weeks    Status  New      PT LONG TERM GOAL #2   Title  Pt will decrease worst neck pain as reported on NPRS by at least 2 points in order to demonstrate clinically significant reduction in back pain.    Baseline  06/02/18 5/10    Time  8    Period  Weeks    Status  New      PT LONG TERM GOAL #3   Title  Patient will demonstrate normal mouth openning (10cm) in order to be able to eat and brush teeth     Baseline  06/02/18 4.5cm    Time  8    Period  Weeks    Status  New      PT LONG TERM GOAL #4   Title  Pt will demonstrate normal TMJ openning and closing without deviations to demonstrate proper mechanics for chewing    Baseline  3/2/2- L lateral deviation with closing    Time  8    Period  Weeks    Status  New            Plan - 06/18/18 1020    Clinical Impression Statement  PT continued TDN for pain modulation and to decrease muscle spasm with good relief, with full symmetrical openning following. PT continued therex progression for postural stability of thoracic/cervical spine, shoulders, and periscapular musculature. Patient has increased difficulty with postural control and maintaining neutral posture throughout dynamic movements, but she is able to complete this with accuracy following cuing.     Personal Factors and Comorbidities  Comorbidity 1;Past/Current Experience;Fitness;Time since onset of injury/illness/exacerbation    Examination-Activity Limitations  Sleep;Caring for Others;Self Feeding     Examination-Participation Restrictions  Cleaning;Interpersonal Relationship;Community Activity    Stability/Clinical Decision Making  Evolving/Moderate complexity    Rehab Potential  Good    PT Frequency  2x / week    PT Duration  8 weeks    PT Treatment/Interventions  ADLs/Self Care Home Management;Electrical Stimulation;Moist Heat;Traction;Iontophoresis 4mg /ml Dexamethasone;Therapeutic exercise;Passive  range of motion;Other (comment);Manual techniques;Neuromuscular re-education;Therapeutic activities;Patient/family education    PT Next Visit Plan  Continued TDN possible lateral pteryogoid, posture training    PT Home Exercise Plan  Rocabado 6x6x6    Consulted and Agree with Plan of Care  Patient       Patient will benefit from skilled therapeutic intervention in order to improve the following deficits and impairments:  Decreased activity tolerance, Decreased endurance, Decreased range of motion, Hypomobility, Increased fascial restricitons, Improper body mechanics, Pain, Postural dysfunction, Impaired flexibility, Increased muscle spasms, Decreased mobility, Decreased coordination  Visit Diagnosis: Arthralgia of left temporomandibular joint     Problem List There are no active problems to display for this patient.  Staci Acosta PT, DPT Staci Acosta 06/18/2018, 10:48 AM   [facility] PHYSICAL AND SPORTS MEDICINE [address] [place], [address] Phone: [phone]   Fax:  [phone]  Name: Teresa Bennett MRN: [id number] Date of Birth: 17-Jul-1990

## 2018-06-20 ENCOUNTER — Ambulatory Visit: Payer: BLUE CROSS/BLUE SHIELD | Admitting: Physical Therapy

## 2018-06-25 ENCOUNTER — Ambulatory Visit: Payer: BLUE CROSS/BLUE SHIELD | Admitting: Physical Therapy

## 2018-06-30 ENCOUNTER — Ambulatory Visit: Payer: BLUE CROSS/BLUE SHIELD | Admitting: Physical Therapy

## 2018-07-03 ENCOUNTER — Encounter: Payer: BLUE CROSS/BLUE SHIELD | Admitting: Physical Therapy

## 2018-07-06 ENCOUNTER — Encounter: Payer: Self-pay | Admitting: Physical Therapy

## 2018-07-06 NOTE — Therapy (Unsigned)
[facility] PH[facility] MEDICINE [address] [place], [address] Phone: [phone]   Fax:  [phone]  Patient Details  Name: Teresa Bennett MRN: [id number] Date of Birth: November 30, 1990 Referring Provider:  No ref. provider found  Encounter Date: 07/06/2018 Left a message with patient to follow up on any PT needs, HEP progress, and interest in telehealth therapy.   Staci Acosta PT, DPT  Staci Acosta 07/06/2018, 3:41 PM  [facility] PHYSICAL AND SPORTS MEDICINE [address], [place], [address] Phone: [phone]   Fax:  [phone]

## 2018-07-07 ENCOUNTER — Encounter: Payer: BLUE CROSS/BLUE SHIELD | Admitting: Physical Therapy

## 2018-07-09 ENCOUNTER — Encounter: Payer: BLUE CROSS/BLUE SHIELD | Admitting: Physical Therapy

## 2018-07-14 ENCOUNTER — Encounter: Payer: BLUE CROSS/BLUE SHIELD | Admitting: Physical Therapy

## 2018-07-17 ENCOUNTER — Encounter: Payer: BLUE CROSS/BLUE SHIELD | Admitting: Physical Therapy

## 2018-07-21 ENCOUNTER — Encounter: Payer: BLUE CROSS/BLUE SHIELD | Admitting: Physical Therapy

## 2018-07-23 ENCOUNTER — Encounter: Payer: BLUE CROSS/BLUE SHIELD | Admitting: Physical Therapy

## 2018-07-28 ENCOUNTER — Encounter: Payer: BLUE CROSS/BLUE SHIELD | Admitting: Physical Therapy

## 2018-07-31 ENCOUNTER — Encounter: Payer: BLUE CROSS/BLUE SHIELD | Admitting: Physical Therapy

## 2020-03-17 NOTE — Progress Notes (Signed)
GYNECOLOGY ANNUAL PHYSICAL EXAM PROGRESS NOTE  Subjective:    Teresa Bennett is a 29 y.o. G55P0011 married female who presents for an annual exam.  She was previously a patient of Mel[facility] [place], last seen in 2019.The patient is sexually active. The patient wears seatbelts: yes. The patient participates in regular exercise: not regularly. Has the patient ever been transfused or tattooed?: no.    1. Reports most recent period very heavy, with passage of "long" clots, and some white tissue. Was more crampy as well.  This is the first occurrence.  2. Notes some weight gain over the past 2 years, had lost 40 lbs but feels like she has gained the majority of that back.    Menstrual History: Menarche age: 78 Patient's last menstrual period was 03/03/2020.  Period Duration (Days): 5 Period Pattern: (!) Irregular Menstrual Flow: Moderate,Light (with clots) Menstrual Control: Tampon Menstrual Control Change Freq (Hours): 2-3 Dysmenorrhea: (!) Moderate Dysmenorrhea Symptoms: Cramping    Gynecologic History Contraception: vasectomy History of STI's: Denies Last Pap: 10/2017. Results were: normal.  Denies h/o abnormal pap smears.    Upstream - 03/18/20 0859      Pregnancy Intention Screening   Does the patient want to become pregnant in the next year? No    Does the patient's partner want to become pregnant in the next year? No    Would the patient like to discuss contraceptive options today? No      Contraception Wrap Up   Current Method Vasectomy          The pregnancy intention screening data noted above was reviewed. Potential methods of contraception were not discussed. The patient elected to proceed with Vasectomy.    OB History  Gravida Para Term Preterm AB Living  2 0 0 0 1 1  SAB IAB Ectopic Multiple Live Births  1 0 0 0 0    # Outcome Date GA Lbr Len/2nd Weight Sex Delivery Anes PTL Lv  2 Gravida           1 SAB             Past Medical History:  Diagnosis  Date  . GERD (gastroesophageal reflux disease)     Past Surgical History:  Procedure Laterality Date  . CESAREAN SECTION      No family history on file.  Social History   Socioeconomic History  . Marital status: Single    Spouse name: Not on file  . Number of children: Not on file  . Years of education: Not on file  . Highest education level: Not on file  Occupational History  . Not on file  Tobacco Use  . Smoking status: Never Smoker  . Smokeless tobacco: Never Used  Vaping Use  . Vaping Use: Never used  Substance and Sexual Activity  . Alcohol use: Yes  . Drug use: Never  . Sexual activity: Yes    Comment: husband vasectomy  Other Topics Concern  . Not on file  Social History Narrative  . Not on file   Social Determinants of Health   Financial Resource Strain: Not on file  Food Insecurity: Not on file  Transportation Needs: Not on file  Physical Activity: Not on file  Stress: Not on file  Social Connections: Not on file  Intimate Partner Violence: Not on file    Current Outpatient Medications on File Prior to Visit  Medication Sig Dispense Refill  . cetirizine (ZYRTEC) 10 MG tablet Take 10  mg by mouth daily.    . ondansetron (ZOFRAN ODT) 4 MG disintegrating tablet Take 1 tablet (4 mg total) by mouth every 8 (eight) hours as needed for nausea or vomiting. (Patient not taking: Reported on 11/28/2017) 12 tablet 0  . ranitidine (ZANTAC) 150 MG tablet Take 150 mg by mouth 2 (two) times daily.     No current facility-administered medications on file prior to visit.    Allergies  Allergen Reactions  . Prevacid [Lansoprazole] Rash    Review of Systems Constitutional: negative for chills, fatigue, fevers and sweats Eyes: negative for irritation, redness and visual disturbance Ears, nose, mouth, throat, and face: negative for hearing loss, nasal congestion, snoring and tinnitus Respiratory: negative for asthma, cough, sputum Cardiovascular: negative for chest  pain, dyspnea, exertional chest pressure/discomfort, irregular heart beat, palpitations and syncope Gastrointestinal: negative for abdominal pain, change in bowel habits, nausea and vomiting Genitourinary: positive for abnormal menstrual periods (see HPI). Negative for genital lesions, sexual problems and vaginal discharge, dysuria and urinary incontinence Integument/breast: negative for breast lump, breast tenderness and nipple discharge Hematologic/lymphatic: negative for bleeding and easy bruising Musculoskeletal:negative for back pain and muscle weakness Neurological: negative for dizziness, headaches, vertigo and weakness Endocrine: negative for diabetic symptoms including polydipsia, polyuria and skin dryness Allergic/Immunologic: negative for hay fever and urticaria       Objective:  Blood pressure 126/82, pulse 84, height 5\' 10"  (1.778 m), weight 232 lb 3.2 oz (105.3 kg), last menstrual period 03/03/2020. Body mass index is 33.32 kg/m.  General Appearance:    Alert, cooperative, no distress, appears stated age, mild obesity  Head:    Normocephalic, without obvious abnormality, atraumatic  Eyes:    PERRL, conjunctiva/corneas clear, EOM's intact, both eyes  Ears:    Normal external ear canals, both ears  Nose:   Nares normal, septum midline, mucosa normal, no drainage or sinus tenderness  Throat:   Lips, mucosa, and tongue normal; teeth and gums normal  Neck:   Supple, symmetrical, trachea midline, no adenopathy; thyroid: no enlargement/tenderness/nodules; no carotid bruit or JVD  Back:     Symmetric, no curvature, ROM normal, no CVA tenderness  Lungs:     Clear to auscultation bilaterally, respirations unlabored  Chest Wall:    No tenderness or deformity   Heart:    Regular rate and rhythm, S1 and S2 normal, no murmur, rub or gallop  Breast Exam:    No tenderness, masses, or nipple abnormality  Abdomen:     Soft, non-tender, bowel sounds active all four quadrants, no masses, no  organomegaly.    Genitalia:    Pelvic:external genitalia normal, vagina without lesions, discharge, or tenderness, rectovaginal septum  normal. Cervix normal in appearance, no cervical motion tenderness, no adnexal masses or tenderness.  Uterus normal size, shape, mobile, regular contours, nontender.  Rectal:    Normal external sphincter.  No hemorrhoids appreciated. Internal exam not done.   Extremities:   Extremities normal, atraumatic, no cyanosis or edema  Pulses:   2+ and symmetric all extremities  Skin:   Skin color, texture, turgor normal, no rashes or lesions  Lymph nodes:   Cervical, supraclavicular, and axillary nodes normal  Neurologic:   CNII-XII intact, normal strength, sensation and reflexes throughout   .  Labs:  Lab Results  Component Value Date   WBC 8.2 05/03/2015   HGB 16.5 (H) 05/03/2015   HCT 48.3 (H) 05/03/2015   MCV 87.8 05/03/2015   PLT 165 05/03/2015    Lab Results  Component Value Date   CREATININE 0.85 11/28/2017   BUN 19 11/28/2017   NA 142 11/28/2017   K 4.0 11/28/2017   CL 106 11/28/2017   CO2 22 11/28/2017    Lab Results  Component Value Date   ALT 30 11/28/2017   AST 21 11/28/2017   ALKPHOS 40 11/28/2017   BILITOT 0.5 11/28/2017    Lab Results  Component Value Date   TSH 1.350 11/28/2017     Assessment:   1. Encounter for well woman exam with routine gynecological exam   2. Flu vaccine need   3. Obesity (BMI 30.0-34.9)   4. Abnormal menstrual cycle     Plan:    Blood tests: CBC with diff, Comprehensive metabolic panel, TSH and Lipid panel, HgbA1c. Breast self exam technique reviewed and patient encouraged to perform self-exam monthly. Contraception: vasectomy. Discussed healthy lifestyle modifications. Pap smear up to date. Due in 1 year.  Abnormal menstrual cycle x 1. Advised that this could be a 1 time occurrence, or could potentially go on to be a potential problem.  Will order pelvic ultrasound to assess for structural  abnormalities.  One time screening for Hepatitis C ordered.  Flu vaccination status: given today.  COVID vaccination status: has not received.  Follow up in 1 year for annual exam. RTC in 1 week for ultrasound.    Hildred Laser, MD [facility]

## 2020-03-18 ENCOUNTER — Other Ambulatory Visit: Payer: Self-pay

## 2020-03-18 ENCOUNTER — Ambulatory Visit (INDEPENDENT_AMBULATORY_CARE_PROVIDER_SITE_OTHER): Payer: Medicaid Other | Admitting: Obstetrics and Gynecology

## 2020-03-18 ENCOUNTER — Encounter: Payer: Self-pay | Admitting: Obstetrics and Gynecology

## 2020-03-18 VITALS — BP 126/82 | HR 84 | Ht 70.0 in | Wt 232.2 lb

## 2020-03-18 DIAGNOSIS — N926 Irregular menstruation, unspecified: Secondary | ICD-10-CM

## 2020-03-18 DIAGNOSIS — Z23 Encounter for immunization: Secondary | ICD-10-CM

## 2020-03-18 DIAGNOSIS — E669 Obesity, unspecified: Secondary | ICD-10-CM

## 2020-03-18 DIAGNOSIS — Z01419 Encounter for gynecological examination (general) (routine) without abnormal findings: Secondary | ICD-10-CM | POA: Diagnosis not present

## 2020-03-18 NOTE — Progress Notes (Signed)
NP/Annual Exam- Pt stated having last cycle was irregular; heavy with clots.  Flu vaccine administered today.

## 2020-03-18 NOTE — Patient Instructions (Addendum)
Preventive Care 21-29 Years Old, Female Preventive care refers to visits with your health care provider and lifestyle choices that can promote health and wellness. This includes:  A yearly physical exam. This may also be called an annual well check.  Regular dental visits and eye exams.  Immunizations.  Screening for certain conditions.  Healthy lifestyle choices, such as eating a healthy diet, getting regular exercise, not using drugs or products that contain nicotine and tobacco, and limiting alcohol use. What can I expect for my preventive care visit? Physical exam Your health care provider will check your:  Height and weight. This may be used to calculate body mass index (BMI), which tells if you are at a healthy weight.  Heart rate and blood pressure.  Skin for abnormal spots. Counseling Your health care provider may ask you questions about your:  Alcohol, tobacco, and drug use.  Emotional well-being.  Home and relationship well-being.  Sexual activity.  Eating habits.  Work and work environment.  Method of birth control.  Menstrual cycle.  Pregnancy history. What immunizations do I need?  Influenza (flu) vaccine  This is recommended every year. Tetanus, diphtheria, and pertussis (Tdap) vaccine  You may need a Td booster every 10 years. Varicella (chickenpox) vaccine  You may need this if you have not been vaccinated. Human papillomavirus (HPV) vaccine  If recommended by your health care provider, you may need three doses over 6 months. Measles, mumps, and rubella (MMR) vaccine  You may need at least one dose of MMR. You may also need a second dose. Meningococcal conjugate (MenACWY) vaccine  One dose is recommended if you are age 19-21 years and a first-year college student living in a residence hall, or if you have one of several medical conditions. You may also need additional booster doses. Pneumococcal conjugate (PCV13) vaccine  You may need  this if you have certain conditions and were not previously vaccinated. Pneumococcal polysaccharide (PPSV23) vaccine  You may need one or two doses if you smoke cigarettes or if you have certain conditions. Hepatitis A vaccine  You may need this if you have certain conditions or if you travel or work in places where you may be exposed to hepatitis A. Hepatitis B vaccine  You may need this if you have certain conditions or if you travel or work in places where you may be exposed to hepatitis B. Haemophilus influenzae type b (Hib) vaccine  You may need this if you have certain conditions. You may receive vaccines as individual doses or as more than one vaccine together in one shot (combination vaccines). Talk with your health care provider about the risks and benefits of combination vaccines. What tests do I need?  Blood tests  Lipid and cholesterol levels. These may be checked every 5 years starting at age 20.  Hepatitis C test.  Hepatitis B test. Screening  Diabetes screening. This is done by checking your blood sugar (glucose) after you have not eaten for a while (fasting).  Sexually transmitted disease (STD) testing.  BRCA-related cancer screening. This may be done if you have a family history of breast, ovarian, tubal, or peritoneal cancers.  Pelvic exam and Pap test. This may be done every 3 years starting at age 21. Starting at age 30, this may be done every 5 years if you have a Pap test in combination with an HPV test. Talk with your health care provider about your test results, treatment options, and if necessary, the need for more tests.   Follow these instructions at home: Eating and drinking   Eat a diet that includes fresh fruits and vegetables, whole grains, lean protein, and low-fat dairy.  Take vitamin and mineral supplements as recommended by your health care provider.  Do not drink alcohol if: ? Your health care provider tells you not to drink. ? You are  pregnant, may be pregnant, or are planning to become pregnant.  If you drink alcohol: ? Limit how much you have to 0-1 drink a day. ? Be aware of how much alcohol is in your drink. In the [place] one drink equals one 12 oz bottle of beer (355 mL), one 5 oz glass of wine (148 mL), or one 1 oz glass of hard liquor (44 mL). Lifestyle  Take daily care of your teeth and gums.  Stay active. Exercise for at least 30 minutes on 5 or more days each week.  Do not use any products that contain nicotine or tobacco, such as cigarettes, e-cigarettes, and chewing tobacco. If you need help quitting, ask your health care provider.  If you are sexually active, practice safe sex. Use a condom or other form of birth control (contraception) in order to prevent pregnancy and STIs (sexually transmitted infections). If you plan to become pregnant, see your health care provider for a preconception visit. What's next?  Visit your health care provider once a year for a well check visit.  Ask your health care provider how often you should have your eyes and teeth checked.  Stay up to date on all vaccines. This information is not intended to replace advice given to you by your health care provider. Make sure you discuss any questions you have with your health care provider. Document Revised: 11/28/2017 Document Reviewed: 11/28/2017 Elsevier Patient Education  [employer].  Influenza (Flu) Vaccine (Inactivated or Recombinant): What You Need to Know 1. Why get vaccinated? Influenza vaccine can prevent influenza (flu). Flu is a contagious disease that spreads around the [place] every year, usually between October and May. Anyone can get the flu, but it is more dangerous for some people. Infants and young children, people 62 years of age and older, pregnant women, and people with certain health conditions or a weakened immune system are at greatest risk of flu complications. Pneumonia, bronchitis, sinus  infections and ear infections are examples of flu-related complications. If you have a medical condition, such as heart disease, cancer or diabetes, flu can make it worse. Flu can cause fever and chills, sore throat, muscle aches, fatigue, cough, headache, and runny or stuffy nose. Some people may have vomiting and diarrhea, though this is more common in children than adults. Each year thousands of people in the [place] die from flu, and many more are hospitalized. Flu vaccine prevents millions of illnesses and flu-related visits to the doctor each year. 2. Influenza vaccine CDC recommends everyone 70 months of age and older get vaccinated every flu season. Children 6 months through 66 years of age may need 2 doses during a single flu season. Everyone else needs only 1 dose each flu season. It takes about 2 weeks for protection to develop after vaccination. There are many flu viruses, and they are always changing. Each year a new flu vaccine is made to protect against three or four viruses that are likely to cause disease in the upcoming flu season. Even when the vaccine doesn't exactly match these viruses, it may still provide some protection. Influenza vaccine does not cause flu. Influenza  vaccine may be given at the same time as other vaccines. 3. Talk with your health care provider Tell your vaccine provider if the person getting the vaccine:  Has had an allergic reaction after a previous dose of influenza vaccine, or has any severe, life-threatening allergies.  Has ever had Guillain-Barr Syndrome (also called GBS). In some cases, your health care provider may decide to postpone influenza vaccination to a future visit. People with minor illnesses, such as a cold, may be vaccinated. People who are moderately or severely ill should usually wait until they recover before getting influenza vaccine. Your health care provider can give you more information. 4. Risks of a vaccine  reaction  Soreness, redness, and swelling where shot is given, fever, muscle aches, and headache can happen after influenza vaccine.  There may be a very small increased risk of Guillain-Barr Syndrome (GBS) after inactivated influenza vaccine (the flu shot). Young children who get the flu shot along with pneumococcal vaccine (PCV13), and/or DTaP vaccine at the same time might be slightly more likely to have a seizure caused by fever. Tell your health care provider if a child who is getting flu vaccine has ever had a seizure. People sometimes faint after medical procedures, including vaccination. Tell your provider if you feel dizzy or have vision changes or ringing in the ears. As with any medicine, there is a very remote chance of a vaccine causing a severe allergic reaction, other serious injury, or death. 5. What if there is a serious problem? An allergic reaction could occur after the vaccinated person leaves the clinic. If you see signs of a severe allergic reaction (hives, swelling of the face and throat, difficulty breathing, a fast heartbeat, dizziness, or weakness), call 9-1-1 and get the person to the nearest hospital. For other signs that concern you, call your health care provider. Adverse reactions should be reported to the Vaccine Adverse Event Reporting System (VAERS). Your health care provider will usually file this report, or you can do it yourself. Visit the VAERS website at www.vaers.LAgents.no or call 930 847 2157.VAERS is only for reporting reactions, and VAERS staff do not give medical advice. 6. The National Vaccine Injury Compensation Program The [employer] Vaccine Injury Compensation Program (VICP) is a federal program that was created to compensate people who may have been injured by certain vaccines. Visit the VICP website at SpiritualWord.at or call (332)087-4227 to learn about the program and about filing a claim. There is a time limit to file a claim for  compensation. 7. How can I learn more?  Ask your healthcare provider.  Call your local or state health department.  Contact the Centers for Disease Control and Prevention (CDC): ? Call 850 200 5752 (1-800-CDC-INFO) or ? Visit CDC's Bi[employer]Room.com.cy Vaccine Information Statement (Interim) Inactivated Influenza Vaccine (11/14/2017) This information is not intended to replace advice given to you by your health care provider. Make sure you discuss any questions you have with your health care provider. Document Revised: 07/08/2018 Document Reviewed: 11/18/2017 Elsevier Patient Education  [employer]. Preventive Care 35-65 Years Old, Female Preventive care refers to visits with your health care provider and lifestyle choices that can promote health and wellness. This includes:  A yearly physical exam. This may also be called an annual well check.  Regular dental visits and eye exams.  Immunizations.  Screening for certain conditions.  Healthy lifestyle choices, such as eating a healthy diet, getting regular exercise, not using drugs or products that contain nicotine and tobacco, and limiting alcohol use.  What can I expect for my preventive care visit? Physical exam Your health care provider will check your:  Height and weight. This may be used to calculate body mass index (BMI), which tells if you are at a healthy weight.  Heart rate and blood pressure.  Skin for abnormal spots. Counseling Your health care provider may ask you questions about your:  Alcohol, tobacco, and drug use.  Emotional well-being.  Home and relationship well-being.  Sexual activity.  Eating habits.  Work and work Statistician.  Method of birth control.  Menstrual cycle.  Pregnancy history. What immunizations do I need?  Influenza (flu) vaccine  This is recommended every year. Tetanus, diphtheria, and pertussis (Tdap) vaccine  You may need a Td booster every 10 years. Varicella  (chickenpox) vaccine  You may need this if you have not been vaccinated. Human papillomavirus (HPV) vaccine  If recommended by your health care provider, you may need three doses over 6 months. Measles, mumps, and rubella (MMR) vaccine  You may need at least one dose of MMR. You may also need a second dose. Meningococcal conjugate (MenACWY) vaccine  One dose is recommended if you are age 57-21 years and a first-year college student living in a residence hall, or if you have one of several medical conditions. You may also need additional booster doses. Pneumococcal conjugate (PCV13) vaccine  You may need this if you have certain conditions and were not previously vaccinated. Pneumococcal polysaccharide (PPSV23) vaccine  You may need one or two doses if you smoke cigarettes or if you have certain conditions. Hepatitis A vaccine  You may need this if you have certain conditions or if you travel or work in places where you may be exposed to hepatitis A. Hepatitis B vaccine  You may need this if you have certain conditions or if you travel or work in places where you may be exposed to hepatitis B. Haemophilus influenzae type b (Hib) vaccine  You may need this if you have certain conditions. You may receive vaccines as individual doses or as more than one vaccine together in one shot (combination vaccines). Talk with your health care provider about the risks and benefits of combination vaccines. What tests do I need?  Blood tests  Lipid and cholesterol levels. These may be checked every 5 years starting at age 36.  Hepatitis C test.  Hepatitis B test. Screening  Diabetes screening. This is done by checking your blood sugar (glucose) after you have not eaten for a while (fasting).  Sexually transmitted disease (STD) testing.  BRCA-related cancer screening. This may be done if you have a family history of breast, ovarian, tubal, or peritoneal cancers.  Pelvic exam and Pap test.  This may be done every 3 years starting at age 74. Starting at age 23, this may be done every 5 years if you have a Pap test in combination with an HPV test. Talk with your health care provider about your test results, treatment options, and if necessary, the need for more tests. Follow these instructions at home: Eating and drinking   Eat a diet that includes fresh fruits and vegetables, whole grains, lean protein, and low-fat dairy.  Take vitamin and mineral supplements as recommended by your health care provider.  Do not drink alcohol if: ? Your health care provider tells you not to drink. ? You are pregnant, may be pregnant, or are planning to become pregnant.  If you drink alcohol: ? Limit how much you have to 0-1  drink a day. ? Be aware of how much alcohol is in your drink. In the [place] one drink equals one 12 oz bottle of beer (355 mL), one 5 oz glass of wine (148 mL), or one 1 oz glass of hard liquor (44 mL). Lifestyle  Take daily care of your teeth and gums.  Stay active. Exercise for at least 30 minutes on 5 or more days each week.  Do not use any products that contain nicotine or tobacco, such as cigarettes, e-cigarettes, and chewing tobacco. If you need help quitting, ask your health care provider.  If you are sexually active, practice safe sex. Use a condom or other form of birth control (contraception) in order to prevent pregnancy and STIs (sexually transmitted infections). If you plan to become pregnant, see your health care provider for a preconception visit. What's next?  Visit your health care provider once a year for a well check visit.  Ask your health care provider how often you should have your eyes and teeth checked.  Stay up to date on all vaccines. This information is not intended to replace advice given to you by your health care provider. Make sure you discuss any questions you have with your health care provider. Document Revised: 11/28/2017 Document  Reviewed: 11/28/2017 Elsevier Patient Education  2020 [place] Breast self-awareness is knowing how your breasts look and feel. Doing breast self-awareness is important. It allows you to catch a breast problem early while it is still small and can be treated. All women should do breast self-awareness, including women who have had breast implants. Tell your doctor if you notice a change in your breasts. What you need:  A mirror.  A well-lit room. How to do a breast self-exam A breast self-exam is one way to learn what is normal for your breasts and to check for changes. To do a breast self-exam: Look for changes  1. Take off all the clothes above your waist. 2. Stand in front of a mirror in a room with good lighting. 3. Put your hands on your hips. 4. Push your hands down. 5. Look at your breasts and nipples in the mirror to see if one breast or nipple looks different from the other. Check to see if: ? The shape of one breast is different. ? The size of one breast is different. ? There are wrinkles, dips, and bumps in one breast and not the other. 6. Look at each breast for changes in the skin, such as: ? Redness. ? Scaly areas. 7. Look for changes in your nipples, such as: ? Liquid around the nipples. ? Bleeding. ? Dimpling. ? Redness. ? A change in where the nipples are. Feel for changes  1. Lie on your back on the floor. 2. Feel each breast. To do this, follow these steps: ? Pick a breast to feel. ? Put the arm closest to that breast above your head. ? Use your other arm to feel the nipple area of your breast. Feel the area with the pads of your three middle fingers by making small circles with your fingers. For the first circle, press lightly. For the second circle, press harder. For the third circle, press even harder. ? Keep making circles with your fingers at the different pressures as you move down your breast. Stop when you feel your  ribs. ? Move your fingers a little toward the center of your body. ? Start making circles with your fingers again, this  time going up until you reach your collarbone. ? Keep making up-and-down circles until you reach your armpit. Remember to keep using the three pressures. ? Feel the other breast in the same way. 3. Sit or stand in the tub or shower. 4. With soapy water on your skin, feel each breast the same way you did in step 2 when you were lying on the floor. Write down what you find Writing down what you find can help you remember what to tell your doctor. Write down:  What is normal for each breast.  Any changes you find in each breast, including: ? The kind of changes you find. ? Whether you have pain. ? Size and location of any lumps.  When you last had your menstrual period. General tips  Check your breasts every month.  If you are breastfeeding, the best time to check your breasts is after you feed your baby or after you use a breast pump.  If you get menstrual periods, the best time to check your breasts is 5-7 days after your menstrual period is over.  With time, you will become comfortable with the self-exam, and you will begin to know if there are changes in your breasts. Contact a doctor if you:  See a change in the shape or size of your breasts or nipples.  See a change in the skin of your breast or nipples, such as red or scaly skin.  Have fluid coming from your nipples that is not normal.  Find a lump or thick area that was not there before.  Have pain in your breasts.  Have any concerns about your breast health. Summary  Breast self-awareness includes looking for changes in your breasts, as well as feeling for changes within your breasts.  Breast self-awareness should be done in front of a mirror in a well-lit room.  You should check your breasts every month. If you get menstrual periods, the best time to check your breasts is 5-7 days after your  menstrual period is over.  Let your doctor know of any changes you see in your breasts, including changes in size, changes on the skin, pain or tenderness, or fluid from your nipples that is not normal. This information is not intended to replace advice given to you by your health care provider. Make sure you discuss any questions you have with your health care provider. Document Revised: 11/05/2017 Document Reviewed: 11/05/2017 Elsevier Patient Education  [place].

## 2020-03-19 ENCOUNTER — Encounter: Payer: Self-pay | Admitting: Obstetrics and Gynecology

## 2020-03-19 LAB — HEMOGLOBIN A1C
Est. average glucose Bld gHb Est-mCnc: 117 mg/dL
Hgb A1c MFr Bld: 5.7 % — ABNORMAL HIGH (ref 4.8–5.6)

## 2020-03-19 LAB — CBC
Hematocrit: 41.9 % (ref 34.0–46.6)
Hemoglobin: 14.4 g/dL (ref 11.1–15.9)
MCH: 30.1 pg (ref 26.6–33.0)
MCHC: 34.4 g/dL (ref 31.5–35.7)
MCV: 88 fL (ref 79–97)
Platelets: 200 10*3/uL (ref 150–450)
RBC: 4.78 x10E6/uL (ref 3.77–5.28)
RDW: 12 % (ref 11.7–15.4)
WBC: 4.9 10*3/uL (ref 3.4–10.8)

## 2020-03-19 LAB — COMPREHENSIVE METABOLIC PANEL
ALT: 51 IU/L — ABNORMAL HIGH (ref 0–32)
AST: 28 IU/L (ref 0–40)
Albumin/Globulin Ratio: 2.1 (ref 1.2–2.2)
Albumin: 4.5 g/dL (ref 3.9–5.0)
Alkaline Phosphatase: 40 IU/L — ABNORMAL LOW (ref 44–121)
BUN/Creatinine Ratio: 15 (ref 9–23)
BUN: 11 mg/dL (ref 6–20)
Bilirubin Total: 0.6 mg/dL (ref 0.0–1.2)
CO2: 23 mmol/L (ref 20–29)
Calcium: 9.4 mg/dL (ref 8.7–10.2)
Chloride: 100 mmol/L (ref 96–106)
Creatinine, Ser: 0.73 mg/dL (ref 0.57–1.00)
GFR calc Af Amer: 129 mL/min/{1.73_m2} (ref >59)
GFR calc non Af Amer: 112 mL/min/{1.73_m2} (ref >59)
Globulin, Total: 2.1 g/dL (ref 1.5–4.5)
Glucose: 85 mg/dL (ref 65–99)
Potassium: 4 mmol/L (ref 3.5–5.2)
Sodium: 139 mmol/L (ref 134–144)
Total Protein: 6.6 g/dL (ref 6.0–8.5)

## 2020-03-19 LAB — LIPID PANEL
Chol/HDL Ratio: 2.5 ratio (ref 0.0–4.4)
Cholesterol, Total: 112 mg/dL (ref 100–199)
HDL: 44 mg/dL (ref >39)
LDL Chol Calc (NIH): 48 mg/dL (ref 0–99)
Triglycerides: 108 mg/dL (ref 0–149)
VLDL Cholesterol Cal: 20 mg/dL (ref 5–40)

## 2020-03-19 LAB — TSH: TSH: 1.64 u[IU]/mL (ref 0.450–4.500)

## 2020-03-19 LAB — HEPATITIS C ANTIBODY: Hep C Virus Ab: <0.1 s/co ratio (ref 0.0–0.9)

## 2020-03-22 ENCOUNTER — Ambulatory Visit (INDEPENDENT_AMBULATORY_CARE_PROVIDER_SITE_OTHER): Payer: Medicaid Other

## 2020-03-22 ENCOUNTER — Other Ambulatory Visit: Payer: Self-pay

## 2020-03-22 DIAGNOSIS — N926 Irregular menstruation, unspecified: Secondary | ICD-10-CM

## 2020-03-23 ENCOUNTER — Encounter: Payer: BLUE CROSS/BLUE SHIELD | Admitting: Certified Nurse Midwife

## 2021-02-01 ENCOUNTER — Telehealth: Payer: Medicaid Other | Admitting: Physician Assistant

## 2021-02-01 DIAGNOSIS — M549 Dorsalgia, unspecified: Secondary | ICD-10-CM | POA: Diagnosis not present

## 2021-02-01 MED ORDER — CYCLOBENZAPRINE HCL 10 MG PO TABS: 10.0000 mg | ORAL_TABLET | Freq: Three times a day (TID) | ORAL | 0 refills | Status: DC | PRN

## 2021-02-01 MED ORDER — NAPROXEN 500 MG PO TABS: 500.0000 mg | ORAL_TABLET | Freq: Two times a day (BID) | ORAL | 0 refills | Status: DC

## 2021-02-01 NOTE — Progress Notes (Signed)
I have spent 5 minutes in review of e-visit questionnaire, review and updating patient chart, medical decision making and response to patient.   Treniyah Lynn Cody Rob Mciver, PA-C    

## 2021-02-01 NOTE — Progress Notes (Signed)

## 2021-02-11 ENCOUNTER — Telehealth: Payer: Medicaid Other | Admitting: Nurse Practitioner

## 2021-02-11 DIAGNOSIS — K047 Periapical abscess without sinus: Secondary | ICD-10-CM | POA: Diagnosis not present

## 2021-02-11 MED ORDER — AMOXICILLIN 500 MG PO CAPS: 500.0000 mg | ORAL_CAPSULE | Freq: Three times a day (TID) | ORAL | 0 refills | Status: AC

## 2021-02-11 NOTE — Progress Notes (Signed)
E-Visit for Dental Pain  We are sorry that you are not feeling well.  Here is how we plan to help!  Based on what you have shared with me in the questionnaire, it sounds like you have a dental infection. I have sent the following to the pharmacy however please note the instructions below.   Amoxicillin 500mg  3 times per day for 10 days  It is imperative that you see a dentist if your symptoms do not get better with taking the antibiotic so that the surgeon or dentist can determine the cause of the dental pain and be sure it is adequately treated.  A toothache or tooth pain is caused when the nerve in the root of a tooth or surrounding a tooth is irritated. Dental (tooth) infection, decay, injury, or loss of a tooth are the most common causes of dental pain. Pain may also occur after an extraction (tooth is pulled out). Pain sometimes originates from other areas and radiates to the jaw, thus appearing to be tooth pain.Bacteria growing inside your mouth can contribute to gum disease and dental decay, both of which can cause pain. A toothache occurs from inflammation of the central portion of the tooth called pulp. The pulp contains nerve endings that are very sensitive to pain. Inflammation to the pulp or pulpitis may be caused by dental cavities, trauma, and infection.    HOME CARE:   For toothaches: Over-the-counter pain medications such as acetaminophen or ibuprofen may be used. Take these as directed on the package while you arrange for a dental appointment. Avoid very cold or hot foods, because they may make the pain worse. You may get relief from biting on a cotton ball soaked in oil of cloves. You can get oil of cloves at most drug stores.  For jaw pain:  Aspirin may be helpful for problems in the joint of the jaw in adults. If pain happens every time you open your mouth widely, the temporomandibular joint (TMJ) may be the source of the pain. Yawning or taking a large bite of food may  worsen the pain. An appointment with your doctor or dentist will help you find the cause.     GET HELP RIGHT AWAY IF:  You have a high fever or chills If you have had a recent head or face injury and develop headache, light headedness, nausea, vomiting, or other symptoms that concern you after an injury to your face or mouth, you could have a more serious injury in addition to your dental injury. A facial rash associated with a toothache: This condition may improve with medication. Contact your doctor for them to decide what is appropriate. Any jaw pain occurring with chest pain: Although jaw pain is most commonly caused by dental disease, it is sometimes referred pain from other areas. People with heart disease, especially people who have had stents placed, people with diabetes, or those who have had heart surgery may have jaw pain as a symptom of heart attack or angina. If your jaw or tooth pain is associated with lightheadedness, sweating, or shortness of breath, you should see a doctor as soon as possible. Trouble swallowing or excessive pain or bleeding from gums: If you have a history of a weakened immune system, diabetes, or steroid use, you may be more susceptible to infections. Infections can often be more severe and extensive or caused by unusual organisms. Dental and gum infections in people with these conditions may require more aggressive treatment. An abscess may need  draining or IV antibiotics, for example.  MAKE SURE YOU   Understand these instructions. Will watch your condition. Will get help right away if you are not doing well or get worse.  Thank you for choosing an e-visit.  Your e-visit answers were reviewed by a board certified advanced clinical practitioner to complete your personal care plan. Depending upon the condition, your plan could have included both over the counter or prescription medications.  Please review your pharmacy choice. Make sure the pharmacy is open  so you can pick up prescription now. If there is a problem, you may contact your provider through [employer] and have the prescription routed to another pharmacy.  Your safety is important to [place]. If you have drug allergies check your prescription carefully.   For the next 24 hours you can use MyChart to ask questions about today's visit, request a non-urgent call back, or ask for a work or school excuse. You will get an email in the next two days asking about your experience. I hope that your e-visit has been valuable and will speed your recovery.

## 2021-02-11 NOTE — Progress Notes (Signed)
I have spent 5 minutes in review of e-visit questionnaire, review and updating patient chart, medical decision making and response to patient.  ° °Imagene Boss W Quint Chestnut, NP ° °  °

## 2021-03-22 ENCOUNTER — Other Ambulatory Visit: Payer: Self-pay

## 2021-03-22 ENCOUNTER — Ambulatory Visit (INDEPENDENT_AMBULATORY_CARE_PROVIDER_SITE_OTHER): Payer: Medicaid Other | Admitting: Obstetrics and Gynecology

## 2021-03-22 ENCOUNTER — Other Ambulatory Visit (HOSPITAL_COMMUNITY)
Admission: RE | Admit: 2021-03-22 | Discharge: 2021-03-22 | Disposition: A | Discharge status: Home / Self Care | Payer: Medicaid Other | Source: Ambulatory Visit | Attending: Obstetrics and Gynecology | Admitting: Obstetrics and Gynecology

## 2021-03-22 ENCOUNTER — Encounter: Payer: Self-pay | Admitting: Obstetrics and Gynecology

## 2021-03-22 VITALS — BP 125/74 | HR 90 | Ht 70.0 in | Wt 231.2 lb

## 2021-03-22 DIAGNOSIS — Z131 Encounter for screening for diabetes mellitus: Secondary | ICD-10-CM | POA: Diagnosis not present

## 2021-03-22 DIAGNOSIS — Z13220 Encounter for screening for lipoid disorders: Secondary | ICD-10-CM | POA: Diagnosis not present

## 2021-03-22 DIAGNOSIS — Z124 Encounter for screening for malignant neoplasm of cervix: Secondary | ICD-10-CM | POA: Insufficient documentation

## 2021-03-22 DIAGNOSIS — Z01419 Encounter for gynecological examination (general) (routine) without abnormal findings: Secondary | ICD-10-CM | POA: Diagnosis present

## 2021-03-22 DIAGNOSIS — N761 Subacute and chronic vaginitis: Secondary | ICD-10-CM

## 2021-03-22 NOTE — Patient Instructions (Incomplete)
Breast Self-Awareness °Breast self-awareness is knowing how your breasts look and feel. Doing breast self-awareness is important. It allows you to catch a breast problem early while it is still small and can be treated. All women should do breast self-awareness, including women who have had breast implants. Tell your doctor if you notice a change in your breasts. °What you need: °A mirror. °A well-lit room. °How to do a breast self-exam °A breast self-exam is one way to learn what is normal for your breasts and to check for changes. To do a breast self-exam: °Look for changes ° °Take off all the clothes above your waist. °Stand in front of a mirror in a room with good lighting. °Put your hands on your hips. °Push your hands down. °Look at your breasts and nipples in the mirror to see if one breast or nipple looks different from the other. Check to see if: °The shape of one breast is different. °The size of one breast is different. °There are wrinkles, dips, and bumps in one breast and not the other. °Look at each breast for changes in the skin, such as: °Redness. °Scaly areas. °Look for changes in your nipples, such as: °Liquid around the nipples. °Bleeding. °Dimpling. °Redness. °A change in where the nipples are. °Feel for changes ° °Lie on your back on the floor. °Feel each breast. To do this, follow these steps: °Pick a breast to feel. °Put the arm closest to that breast above your head. °Use your other arm to feel the nipple area of your breast. Feel the area with the pads of your three middle fingers by making small circles with your fingers. For the first circle, press lightly. For the second circle, press harder. For the third circle, press even harder. °Keep making circles with your fingers at the different pressures as you move down your breast. Stop when you feel your ribs. °Move your fingers a little toward the center of your body. °Start making circles with your fingers again, this time going up until  you reach your collarbone. °Keep making up-and-down circles until you reach your armpit. Remember to keep using the three pressures. °Feel the other breast in the same way. °Sit or stand in the tub or shower. °With soapy water on your skin, feel each breast the same way you did in step 2 when you were lying on the floor. °Write down what you find °Writing down what you find can help you remember what to tell your doctor. Write down: °What is normal for each breast. °Any changes you find in each breast, including: °The kind of changes you find. °Whether you have pain. °Size and location of any lumps. °When you last had your menstrual period. °General tips °Check your breasts every month. °If you are breastfeeding, the best time to check your breasts is after you feed your baby or after you use a breast pump. °If you get menstrual periods, the best time to check your breasts is 5-7 days after your menstrual period is over. °With time, you will become comfortable with the self-exam, and you will begin to know if there are changes in your breasts. °Contact a doctor if you: °See a change in the shape or size of your breasts or nipples. °See a change in the skin of your breast or nipples, such as red or scaly skin. °Have fluid coming from your nipples that is not normal. °Find a lump or thick area that was not there before. °Have pain in   your breasts. °Have any concerns about your breast health. °Summary °Breast self-awareness includes looking for changes in your breasts, as well as feeling for changes within your breasts. °Breast self-awareness should be done in front of a mirror in a well-lit room. °You should check your breasts every month. If you get menstrual periods, the best time to check your breasts is 5-7 days after your menstrual period is over. °Let your doctor know of any changes you see in your breasts, including changes in size, changes on the skin, pain or tenderness, or fluid from your nipples that is not  normal. °This information is not intended to replace advice given to you by your health care provider. Make sure you discuss any questions you have with your health care provider. °Document Revised: 11/05/2017 Document Reviewed: 11/05/2017 °Elsevier Patient Education © 2022 [employer] °Preventive Care 21-39 Years Old, Female °Preventive care refers to lifestyle choices and visits with your health care provider that can promote health and wellness. Preventive care visits are also called wellness exams. °What can I expect for my preventive care visit? °Counseling °During your preventive care visit, your health care provider may ask about your: °Medical history, including: °Past medical problems. °Family medical history. °Pregnancy history. °Current health, including: °Menstrual cycle. °Method of birth control. °Emotional well-being. °Home life and relationship well-being. °Sexual activity and sexual health. °Lifestyle, including: °Alcohol, nicotine or tobacco, and drug use. °Access to firearms. °Diet, exercise, and sleep habits. °Work and work environment. °Sunscreen use. °Safety issues such as seatbelt and bike helmet use. °Physical exam °Your health care provider may check your: °Height and weight. These may be used to calculate your BMI (body mass index). BMI is a measurement that tells if you are at a healthy weight. °Waist circumference. This measures the distance around your waistline. This measurement also tells if you are at a healthy weight and may help predict your risk of certain diseases, such as type 2 diabetes and high blood pressure. °Heart rate and blood pressure. °Body temperature. °Skin for abnormal spots. °What immunizations do I need? °Vaccines are usually given at various ages, according to a schedule. Your health care provider will recommend vaccines for you based on your age, medical history, and lifestyle or other factors, such as travel or where you work. °What tests do I  need? °Screening °Your health care provider may recommend screening tests for certain conditions. This may include: °Pelvic exam and Pap test. °Lipid and cholesterol levels. °Diabetes screening. This is done by checking your blood sugar (glucose) after you have not eaten for a while (fasting). °Hepatitis B test. °Hepatitis C test. °HIV (human immunodeficiency virus) test. °STI (sexually transmitted infection) testing, if you are at risk. °BRCA-related cancer screening. This may be done if you have a family history of breast, ovarian, tubal, or peritoneal cancers. °Talk with your health care provider about your test results, treatment options, and if necessary, the need for more tests. °Follow these instructions at home: °Eating and drinking ° °Eat a healthy diet that includes fresh fruits and vegetables, whole grains, lean protein, and low-fat dairy products. °Take vitamin and mineral supplements as recommended by your health care provider. °Do not drink alcohol if: °Your health care provider tells you not to drink. °You are pregnant, may be pregnant, or are planning to become pregnant. °If you drink alcohol: °Limit how much you have to 0-1 drink a day. °Know how much alcohol is in your drink. In the [place] one drink equals one 12 oz   bottle of beer (355 mL), one 5 oz glass of wine (148 mL), or one 1 oz glass of hard liquor (44 mL). Lifestyle Brush your teeth every morning and night with fluoride toothpaste. Floss one time each day. Exercise for at least 30 minutes 5 or more days each week. Do not use any products that contain nicotine or tobacco. These products include cigarettes, chewing tobacco, and vaping devices, such as e-cigarettes. If you need help quitting, ask your health care provider. Do not use drugs. If you are sexually active, practice safe sex. Use a condom or other form of protection to prevent STIs. If you do not wish to become pregnant, use a form of birth control. If you plan to become  pregnant, see your health care provider for a prepregnancy visit. Find healthy ways to manage stress, such as: Meditation, yoga, or listening to music. Journaling. Talking to a trusted person. Spending time with friends and family. Minimize exposure to UV radiation to reduce your risk of skin cancer. Safety Always wear your seat belt while driving or riding in a vehicle. Do not drive: If you have been drinking alcohol. Do not ride with someone who has been drinking. If you have been using any mind-altering substances or drugs. While texting. When you are tired or distracted. Wear a helmet and other protective equipment during sports activities. If you have firearms in your house, make sure you follow all gun safety procedures. Seek help if you have been physically or sexually abused. What's next? Go to your health care provider once a year for an annual wellness visit. Ask your health care provider how often you should have your eyes and teeth checked. Stay up to date on all vaccines. This information is not intended to replace advice given to you by your health care provider. Make sure you discuss any questions you have with your health care provider. Document Revised: 09/14/2020 Document Reviewed: 09/14/2020 Elsevier Patient Education  [employer].

## 2021-03-22 NOTE — Progress Notes (Signed)
GYNECOLOGY ANNUAL PHYSICAL EXAM PROGRESS NOTE  Subjective:    Teresa Bennett is a 30 y.o. [phone] female who presents for an annual exam. The patient has no complaints today. The patient is sexually active. The patient wears seatbelts: yes. The patient participates in regular exercise: not regularly. Has the patient ever been transfused or tattooed?: no.    Notes yeast infections just prior to her cycle for the past 2-3 months. Wonders if her diet has anything to do with it as she has not been eating as healthy as she should.   Menstrual History: Menarche age: 38 Patient's last menstrual period was 03/05/2021. Period Duration (Days): 4-5 Period Pattern: (!) Irregular Menstrual Flow: Moderate, Light Menstrual Control: Tampon Menstrual Control Change Freq (Hours): 2-3 Dysmenorrhea: (!) Moderate Dysmenorrhea Symptoms: Cramping   Gynecologic History:  Contraception: vasectomy History of STI's: Denies Last Pap: 11/28/2017. Results were: normal. Denies h/o abnormal pap smears. Last mammogram: Not Age Appropriate   OB History  Gravida Para Term Preterm AB Living  3 2 2  0 1 2  SAB IAB Ectopic Multiple Live Births  1 0 0 0 2    # Outcome Date GA Lbr Len/2nd Weight Sex Delivery Anes PTL Lv  3 Term 01/13/17    F Vag-Spont Spinal  LIV  2 SAB 2017          1 Term 12/24/13    M CS-Unspec   LIV    Past Medical History:  Diagnosis Date   GERD (gastroesophageal reflux disease)     Past Surgical History:  Procedure Laterality Date   CESAREAN SECTION      Family History  Problem Relation Age of Onset   Hypertension Mother    Hypertension Father    Diabetes Father     Social History   Socioeconomic History   Marital status: Single    Spouse name: Not on file   Number of children: Not on file   Years of education: Not on file   Highest education level: Not on file  Occupational History   Not on file  Tobacco Use   Smoking status: Never   Smokeless tobacco:  Never  Vaping Use   Vaping Use: Never used  Substance and Sexual Activity   Alcohol use: Yes   Drug use: Never   Sexual activity: Yes    Comment: husband vasectomy  Other Topics Concern   Not on file  Social History Narrative   Not on file   Social Determinants of Health   Financial Resource Strain: Not on file  Food Insecurity: Not on file  Transportation Needs: Not on file  Physical Activity: Not on file  Stress: Not on file  Social Connections: Not on file  Intimate Partner Violence: Not on file    Current Outpatient Medications on File Prior to Visit  Medication Sig Dispense Refill   cetirizine (ZYRTEC) 10 MG tablet Take 10 mg by mouth daily.     cyclobenzaprine (FLEXERIL) 10 MG tablet Take 1 tablet (10 mg total) by mouth 3 (three) times daily as needed for muscle spasms. 15 tablet 0   naproxen (NAPROSYN) 500 MG tablet Take 1 tablet (500 mg total) by mouth 2 (two) times daily with a meal. 20 tablet 0   No current facility-administered medications on file prior to visit.    Allergies  Allergen Reactions   Prevacid [Lansoprazole] Rash     Review of Systems Constitutional: negative for chills, fatigue, fevers and sweats Eyes: negative for  irritation, redness and visual disturbance Ears, nose, mouth, throat, and face: negative for hearing loss, nasal congestion, snoring and tinnitus Respiratory: negative for asthma, cough, sputum Cardiovascular: negative for chest pain, dyspnea, exertional chest pressure/discomfort, irregular heart beat, palpitations and syncope Gastrointestinal: negative for abdominal pain, change in bowel habits, nausea and vomiting Genitourinary: negative for abnormal menstrual periods, genital lesions, sexual problems and vaginal discharge, dysuria and urinary incontinence Integument/breast: negative for breast lump, breast tenderness and nipple discharge Hematologic/lymphatic: negative for bleeding and easy bruising Musculoskeletal:negative for  back pain and muscle weakness Neurological: negative for dizziness, headaches, vertigo and weakness Endocrine: negative for diabetic symptoms including polydipsia, polyuria and skin dryness Allergic/Immunologic: negative for hay fever and urticaria      Objective:  Blood pressure 125/74, pulse 90, height 5\' 10"  (1.778 m), weight 231 lb 3.2 oz (104.9 kg), last menstrual period 03/05/2021, SpO2 (!) 16 %. Body mass index is 33.17 kg/m.    General Appearance:    Alert, cooperative, no distress, appears stated age  Head:    Normocephalic, without obvious abnormality, atraumatic  Eyes:    PERRL, conjunctiva/corneas clear, EOM's intact, both eyes  Ears:    Normal external ear canals, both ears  Nose:   Nares normal, septum midline, mucosa normal, no drainage or sinus tenderness  Throat:   Lips, mucosa, and tongue normal; teeth and gums normal  Neck:   Supple, symmetrical, trachea midline, no adenopathy; thyroid: no enlargement/tenderness/nodules; no carotid bruit or JVD  Back:     Symmetric, no curvature, ROM normal, no CVA tenderness  Lungs:     Clear to auscultation bilaterally, respirations unlabored  Chest Wall:    No tenderness or deformity   Heart:    Regular rate and rhythm, S1 and S2 normal, no murmur, rub or gallop  Breast Exam:    No tenderness, masses, or nipple abnormality  Abdomen:     Soft, non-tender, bowel sounds active all four quadrants, no masses, no organomegaly.    Genitalia:    Pelvic:external genitalia normal, vagina without lesions, discharge, or tenderness, rectovaginal septum  normal. Cervix normal in appearance, no cervical motion tenderness, no adnexal masses or tenderness.  Uterus normal size, shape, mobile, regular contours, nontender.  Rectal:    Normal external sphincter.  No hemorrhoids appreciated. Internal exam not done.   Extremities:   Extremities normal, atraumatic, no cyanosis or edema  Pulses:   2+ and symmetric all extremities  Skin:   Skin color,  texture, turgor normal, no rashes or lesions  Lymph nodes:   Cervical, supraclavicular, and axillary nodes normal  Neurologic:   CNII-XII intact, normal strength, sensation and reflexes throughout   .  Labs:  Lab Results  Component Value Date   WBC 4.9 03/18/2020   HGB 14.4 03/18/2020   HCT 41.9 03/18/2020   MCV 88 03/18/2020   PLT 200 03/18/2020    Lab Results  Component Value Date   CREATININE 0.73 03/18/2020   BUN 11 03/18/2020   NA 139 03/18/2020   K 4.0 03/18/2020   CL 100 03/18/2020   CO2 23 03/18/2020    Lab Results  Component Value Date   ALT 51 (H) 03/18/2020   AST 28 03/18/2020   ALKPHOS 40 (L) 03/18/2020   BILITOT 0.6 03/18/2020    Lab Results  Component Value Date   TSH 1.640 03/18/2020     Assessment:   1. Encounter for well woman exam with routine gynecological exam   2. Screening for diabetes mellitus (DM)  3. Screening cholesterol level   4. Cervical cancer screening   5. Subacute vaginitis      Plan:  Blood tests: CBC with diff, Comprehensive metabolic panel, Lipoproteins, and A1c. Breast self exam technique reviewed and patient encouraged to perform self-exam monthly. Contraception: vasectomy. Discussed healthy lifestyle modifications. Mammogram  Not age appropriate Pap smear  Ordered . COVID vaccination status: UTD Flu vaccine: declines for now, plans to receive at same time as her son.  Discussed dietary modifications to prevent recurrent vaginitis. Can use OTC Monistat for symptoms. Follow up in 1 year for annual exam  Rubie Maid, MD [facility]

## 2021-03-23 LAB — CBC
Hematocrit: 46.4 % (ref 34.0–46.6)
Hemoglobin: 15.4 g/dL (ref 11.1–15.9)
MCH: 29.6 pg (ref 26.6–33.0)
MCHC: 33.2 g/dL (ref 31.5–35.7)
MCV: 89 fL (ref 79–97)
Platelets: 177 10*3/uL (ref 150–450)
RBC: 5.21 x10E6/uL (ref 3.77–5.28)
RDW: 12.8 % (ref 11.7–15.4)
WBC: 5.3 10*3/uL (ref 3.4–10.8)

## 2021-03-23 LAB — COMPREHENSIVE METABOLIC PANEL
ALT: 48 IU/L — ABNORMAL HIGH (ref 0–32)
AST: 26 IU/L (ref 0–40)
Albumin/Globulin Ratio: 2.6 — ABNORMAL HIGH (ref 1.2–2.2)
Albumin: 4.1 g/dL (ref 3.9–5.0)
Alkaline Phosphatase: 39 IU/L — ABNORMAL LOW (ref 44–121)
BUN/Creatinine Ratio: 14 (ref 9–23)
BUN: 12 mg/dL (ref 6–20)
Bilirubin Total: 0.6 mg/dL (ref 0.0–1.2)
CO2: 24 mmol/L (ref 20–29)
Calcium: 8.7 mg/dL (ref 8.7–10.2)
Chloride: 105 mmol/L (ref 96–106)
Creatinine, Ser: 0.84 mg/dL (ref 0.57–1.00)
Globulin, Total: 1.6 g/dL (ref 1.5–4.5)
Glucose: 166 mg/dL — ABNORMAL HIGH (ref 70–99)
Potassium: 4 mmol/L (ref 3.5–5.2)
Sodium: 139 mmol/L (ref 134–144)
Total Protein: 5.7 g/dL — ABNORMAL LOW (ref 6.0–8.5)
eGFR: 96 mL/min/{1.73_m2} (ref >59)

## 2021-03-23 LAB — LIPID PANEL
Chol/HDL Ratio: 3 ratio (ref 0.0–4.4)
Cholesterol, Total: 106 mg/dL (ref 100–199)
HDL: 35 mg/dL — ABNORMAL LOW (ref >39)
LDL Chol Calc (NIH): 45 mg/dL (ref 0–99)
Triglycerides: 154 mg/dL — ABNORMAL HIGH (ref 0–149)
VLDL Cholesterol Cal: 26 mg/dL (ref 5–40)

## 2021-03-23 LAB — HEMOGLOBIN A1C
Est. average glucose Bld gHb Est-mCnc: 140 mg/dL
Hgb A1c MFr Bld: 6.5 % — ABNORMAL HIGH (ref 4.8–5.6)

## 2021-03-24 ENCOUNTER — Encounter: Payer: Self-pay | Admitting: Obstetrics and Gynecology

## 2021-03-26 ENCOUNTER — Encounter: Payer: Self-pay | Admitting: Obstetrics and Gynecology

## 2021-03-26 ENCOUNTER — Other Ambulatory Visit: Payer: Self-pay | Admitting: Obstetrics and Gynecology

## 2021-03-26 DIAGNOSIS — E119 Type 2 diabetes mellitus without complications: Secondary | ICD-10-CM | POA: Insufficient documentation

## 2021-03-26 HISTORY — DX: Type 2 diabetes mellitus without complications: E11.9

## 2021-03-27 ENCOUNTER — Encounter: Payer: Self-pay | Admitting: Obstetrics and Gynecology

## 2021-03-28 ENCOUNTER — Telehealth: Payer: Self-pay | Admitting: Obstetrics and Gynecology

## 2021-03-28 NOTE — Telephone Encounter (Signed)
Sent patient mychart message about A1c and rtc for lab check.

## 2021-03-28 NOTE — Telephone Encounter (Signed)
Pt states that she had a missed phone call, she believes it was in regard to recent lab results. Please Advise.

## 2021-03-31 LAB — CYTOLOGY - PAP
Comment: NEGATIVE
Diagnosis: NEGATIVE
High risk HPV: NEGATIVE

## 2021-06-28 ENCOUNTER — Other Ambulatory Visit: Payer: Self-pay

## 2021-06-28 ENCOUNTER — Ambulatory Visit: Payer: Medicaid Other

## 2021-06-28 DIAGNOSIS — E119 Type 2 diabetes mellitus without complications: Secondary | ICD-10-CM

## 2021-06-29 LAB — HEMOGLOBIN A1C
Est. average glucose Bld gHb Est-mCnc: 131 mg/dL
Hgb A1c MFr Bld: 6.2 % — ABNORMAL HIGH (ref 4.8–5.6)

## 2021-10-23 ENCOUNTER — Telehealth: Payer: Medicaid Other | Admitting: Physician Assistant

## 2021-10-23 DIAGNOSIS — M549 Dorsalgia, unspecified: Secondary | ICD-10-CM | POA: Diagnosis not present

## 2021-10-23 MED ORDER — NAPROXEN 500 MG PO TABS: 500.0000 mg | ORAL_TABLET | Freq: Two times a day (BID) | ORAL | 0 refills | Status: DC

## 2021-10-23 MED ORDER — BACLOFEN 10 MG PO TABS: 10.0000 mg | ORAL_TABLET | Freq: Three times a day (TID) | ORAL | 0 refills | Status: DC

## 2021-10-23 NOTE — Progress Notes (Signed)

## 2021-12-06 ENCOUNTER — Emergency Department
Admission: EM | Admit: 2021-12-06 | Discharge: 2021-12-06 | Disposition: A | Discharge status: Home / Self Care | Payer: Medicaid Other | Attending: Emergency Medicine | Admitting: Emergency Medicine

## 2021-12-06 ENCOUNTER — Other Ambulatory Visit: Payer: Self-pay

## 2021-12-06 ENCOUNTER — Encounter: Payer: Self-pay | Admitting: Emergency Medicine

## 2021-12-06 ENCOUNTER — Emergency Department: Payer: Medicaid Other

## 2021-12-06 DIAGNOSIS — W01198A Fall on same level from slipping, tripping and stumbling with subsequent striking against other object, initial encounter: Secondary | ICD-10-CM | POA: Diagnosis not present

## 2021-12-06 DIAGNOSIS — S139XXA Sprain of joints and ligaments of unspecified parts of neck, initial encounter: Secondary | ICD-10-CM

## 2021-12-06 DIAGNOSIS — E119 Type 2 diabetes mellitus without complications: Secondary | ICD-10-CM | POA: Insufficient documentation

## 2021-12-06 DIAGNOSIS — W19XXXA Unspecified fall, initial encounter: Secondary | ICD-10-CM

## 2021-12-06 DIAGNOSIS — S169XXA Unspecified injury of muscle, fascia and tendon at neck level, initial encounter: Secondary | ICD-10-CM | POA: Diagnosis present

## 2021-12-06 DIAGNOSIS — S134XXA Sprain of ligaments of cervical spine, initial encounter: Secondary | ICD-10-CM | POA: Diagnosis not present

## 2021-12-06 DIAGNOSIS — S0990XA Unspecified injury of head, initial encounter: Secondary | ICD-10-CM | POA: Insufficient documentation

## 2021-12-06 NOTE — ED Provider Triage Note (Signed)
Emergency Medicine Provider Triage Evaluation Note  Teresa Bennett , a 31 y.o. female  was evaluated in triage.  Pt complains of headache, neck pain after a fall earlier today.  Review of Systems  Positive: Today, neck pain, tingling left side of face Negative: Vomiting  Physical Exam  BP (!) 133/101 (BP Location: Left Arm)   Pulse 83   Temp 98 F (36.7 C) (Oral)   Resp 18   Ht 5\' 10"  (1.778 m)   Wt 99.8 kg   SpO2 100%   BMI 31.57 kg/m  Gen:   Awake, no distress   Resp:  Normal effort  MSK:   Moves extremities without difficulty  Other:  Cranial nerves II through XII grossly intact, C-spine is tender  Medical Decision Making  Medically screening exam initiated at 2:50 PM.  Appropriate orders placed.  was informed that the remainder of the evaluation will be completed by another provider, this initial triage assessment does not replace that evaluation, and the importance of remaining in the ED until their evaluation is complete.     Marlene Bast, PA-C 12/06/21 1451

## 2021-12-06 NOTE — ED Provider Notes (Signed)
[facility] Provider Note    Event Date/Time   First MD Initiated Contact with Patient 12/06/21 1550     (approximate)   History   Fall   HPI  Teresa Bennett is a 31 y.o. female with history of diabetes, GERD presents emergency department after a fall.  Patient got tripped up by her dogs and hit her head neck.  States she felt stinging along the left side of her face but does have trigeminal neuralgia.  States also had some twitching.  No numbness or tingling in the arms.  No LOC.  No vomiting but did feel nauseated.      Physical Exam   Triage Vital Signs: ED Triage Vitals  Enc Vitals Group     BP 12/06/21 1444 (!) 133/101     Pulse Rate 12/06/21 1444 83     Resp 12/06/21 1444 18     Temp 12/06/21 1444 98 F (36.7 C)     Temp Source 12/06/21 1444 Oral     SpO2 12/06/21 1444 100 %     Weight 12/06/21 1445 220 lb (99.8 kg)     Height 12/06/21 1445 5\' 10"  (1.778 m)     Head Circumference --      Peak Flow --      Pain Score 12/06/21 1445 5     Pain Loc --      Pain Edu? --      Excl. in GC? --     Most recent vital signs: Vitals:   12/06/21 1444  BP: (!) 133/101  Pulse: 83  Resp: 18  Temp: 98 F (36.7 C)  SpO2: 100%     General: Awake, no distress.   CV:  Good peripheral perfusion. regular rate and  rhythm Resp:  Normal effort. Lungs CTA Abd:  No distention.   Other:  Cranial nerves II through XII grossly intact, grips equal bilaterally, patient is able to ambulate without difficulty   ED Results / Procedures / Treatments   Labs (all labs ordered are listed, but only abnormal results are displayed) Labs Reviewed - No data to display   EKG     RADIOLOGY CT of the head and C-spine    PROCEDURES:   Procedures   MEDICATIONS ORDERED IN ED: Medications - No data to display   IMPRESSION / MDM / ASSESSMENT AND PLAN / ED COURSE  I reviewed the triage vital signs and the nursing notes.                               Differential diagnosis includes, but is not limited to, fracture, sprain, contusion, subdural  Patient's presentation is most consistent with acute presentation with potential threat to life or bodily function.   CT of the head and C-spine independently reviewed and interpreted by me as being negative for any acute abnormality.  I did explain these findings to the patient.  She was instructed to continue her regular medications.  Return emergency department worsening.  She is in agreement with treatment plan.  Discharged stable condition.  Comfort measures were discussed.      FINAL CLINICAL IMPRESSION(S) / ED DIAGNOSES   Final diagnoses:  Fall, initial encounter  Minor head injury, initial encounter  Cervical sprain, initial encounter     Rx / DC Orders   ED Discharge Orders     None        Note:  This document was prepared using Dragon voice recognition software and may include unintentional dictation errors.    Faythe Ghee, PA-C 12/06/21 1556    Chesley Noon, MD 12/07/21 [phone]

## 2021-12-06 NOTE — ED Notes (Signed)
Pt signed esignature  d/c inst to pt.   

## 2021-12-06 NOTE — Discharge Instructions (Signed)
Follow-up with your regular doctor as needed.  Return emergency department worsening.  Use medications as prescribed.  You may also take Tylenol and ibuprofen.  Apply ice to all areas that hurt

## 2021-12-06 NOTE — ED Triage Notes (Signed)
Patient to ED for fall. Patient she got tripped up by dogs. Patient c/o head, neck, and upset stomach. Patient states she "doesn't feel right like her eyes are twitching." Denies visual difficulties or LOC. Not on blood thinners.

## 2022-03-22 ENCOUNTER — Telehealth: Payer: Medicaid Other | Admitting: Physician Assistant

## 2022-03-22 DIAGNOSIS — B3731 Acute candidiasis of vulva and vagina: Secondary | ICD-10-CM | POA: Diagnosis not present

## 2022-03-23 MED ORDER — FLUCONAZOLE 150 MG PO TABS: 150.0000 mg | ORAL_TABLET | ORAL | 0 refills | Status: DC | PRN

## 2022-03-23 NOTE — Progress Notes (Signed)
E-Visit for Vaginal Symptoms  We are sorry that you are not feeling well. Here is how we plan to help! Based on what you shared with me it looks like you: May have a yeast vaginosis  Vaginosis is an inflammation of the vagina that can result in discharge, itching and pain. The cause is usually a change in the normal balance of vaginal bacteria or an infection. Vaginosis can also result from reduced estrogen levels after menopause.  The most common causes of vaginosis are:   Bacterial vaginosis which results from an overgrowth of one on several organisms that are normally present in your vagina.   Yeast infections which are caused by a naturally occurring fungus called candida.   Vaginal atrophy (atrophic vaginosis) which results from the thinning of the vagina from reduced estrogen levels after menopause.   Trichomoniasis which is caused by a parasite and is commonly transmitted by sexual intercourse.  Factors that increase your risk of developing vaginosis include: Medications, such as antibiotics and steroids Uncontrolled diabetes Use of hygiene products such as bubble bath, vaginal spray or vaginal deodorant Douching Wearing damp or tight-fitting clothing Using an intrauterine device (IUD) for birth control Hormonal changes, such as those associated with pregnancy, birth control pills or menopause Sexual activity Having a sexually transmitted infection  Your treatment plan is A single Diflucan (fluconazole) 150mg  tablet once.  I have electronically sent this prescription into the pharmacy that you have chosen. With an extra pill to take 3 days after first dose.   Be sure to take all of the medication as directed. Stop taking any medication if you develop a rash, tongue swelling or shortness of breath. Mothers who are breast feeding should consider pumping and discarding their breast milk while on these antibiotics. However, there is no consensus that infant exposure at these doses  would be harmful.  Remember that medication creams can weaken latex condoms.   HOME CARE:  Good hygiene may prevent some types of vaginosis from recurring and may relieve some symptoms:  Avoid baths, hot tubs and whirlpool spas. Rinse soap from your outer genital area after a shower, and dry the area well to prevent irritation. Don't use scented or harsh soaps, such as those with deodorant or antibacterial action. Avoid irritants. These include scented tampons and pads. Wipe from front to back after using the toilet. Doing so avoids spreading fecal bacteria to your vagina.  Other things that may help prevent vaginosis include:  Don't douche. Your vagina doesn't require cleansing other than normal bathing. Repetitive douching disrupts the normal organisms that reside in the vagina and can actually increase your risk of vaginal infection. Douching won't clear up a vaginal infection. Use a latex condom. Both female and female latex condoms may help you avoid infections spread by sexual contact. Wear cotton underwear. Also wear pantyhose with a cotton crotch. If you feel comfortable without it, skip wearing underwear to bed. Yeast thrives in [place] Your symptoms should improve in the next day or two.  GET HELP RIGHT AWAY IF:  You have pain in your lower abdomen ( pelvic area or over your ovaries) You develop nausea or vomiting You develop a fever Your discharge changes or worsens You have persistent pain with intercourse You develop shortness of breath, a rapid pulse, or you faint.  These symptoms could be signs of problems or infections that need to be evaluated by a medical provider now.  MAKE SURE YOU   Understand these instructions. Will  watch your condition. Will get help right away if you are not doing well or get worse.  Thank you for choosing an e-visit.  Your e-visit answers were reviewed by a board certified advanced clinical practitioner to complete your  personal care plan. Depending upon the condition, your plan could have included both over the counter or prescription medications.  Please review your pharmacy choice. Make sure the pharmacy is open so you can pick up prescription now. If there is a problem, you may contact your provider through [employer] and have the prescription routed to another pharmacy.  Your safety is important to [place]. If you have drug allergies check your prescription carefully.   For the next 24 hours you can use MyChart to ask questions about today's visit, request a non-urgent call back, or ask for a work or school excuse. You will get an email in the next two days asking about your experience. I hope that your e-visit has been valuable and will speed your recovery.  I have spent 5 minutes in review of e-visit questionnaire, review and updating patient chart, medical decision making and response to patient.   Mar Daring, PA-C

## 2022-05-23 ENCOUNTER — Telehealth: Payer: Self-pay | Admitting: Obstetrics and Gynecology

## 2022-05-23 NOTE — Telephone Encounter (Signed)
This pt is needing to have her A1C checked.  She was wanting to have that lab work done here.  Can she get that done here?

## 2022-05-23 NOTE — Telephone Encounter (Signed)
She hasn't been seen in over a year. Is she currently following with anyone regarding her prediabetes? She also has some mild cholesterol issues that are needing to be monitored.  I would recommend having an appointment with [place] if she's not been seen anywhere else.

## 2022-05-24 NOTE — Telephone Encounter (Signed)
I called her back and going to get her scheduled with you to monitor the prediabetes and cholesterol issues.

## 2022-05-25 ENCOUNTER — Telehealth: Payer: Self-pay

## 2022-05-25 NOTE — Telephone Encounter (Signed)
Pt called triage returning a call. Pt aware it looks like [place] [facility] called her

## 2022-05-28 ENCOUNTER — Other Ambulatory Visit (HOSPITAL_COMMUNITY)
Admission: RE | Admit: 2022-05-28 | Discharge: 2022-05-28 | Disposition: A | Discharge status: Home / Self Care | Payer: Medicaid Other | Source: Ambulatory Visit | Attending: Obstetrics and Gynecology | Admitting: Obstetrics and Gynecology

## 2022-05-28 ENCOUNTER — Ambulatory Visit: Payer: Medicaid Other

## 2022-05-28 VITALS — BP 109/75 | HR 93 | Ht 70.0 in | Wt 210.8 lb

## 2022-05-28 DIAGNOSIS — N898 Other specified noninflammatory disorders of vagina: Secondary | ICD-10-CM | POA: Insufficient documentation

## 2022-05-28 NOTE — Progress Notes (Signed)
    NURSE VISIT NOTE  Subjective:    Patient ID: Teresa Bennett, female    DOB: 1990-08-09, 32 y.o.   MRN: [id number]  HPI  Patient is a 32 y.o. G79P2012 female who presents for white, curd-like, and odorless vaginal discharge for 4 week(s). Denies abnormal vaginal bleeding or significant pelvic pain or fever. denies UTI Symptoms. Patient does not history of known exposure to STD. She states and increase in blood sugar readings at home, 260's and 320 fasting, next visit is on 06/19/22.    Objective:    BP 109/75   Pulse 93   Ht 5' 10"$  (1.778 m)   Wt 210 lb 12.8 oz (95.6 kg)   BMI 30.25 kg/m     Assessment:   monilia vaginitis and bacterial vaginosis  Plan:   Yeast infection and BV DNA  probe sent to lab. Treatment:  await for results for further treatment ROV prn if symptoms persist or worsen.   Marykay Lex, CMA

## 2022-05-29 LAB — CERVICOVAGINAL ANCILLARY ONLY
Bacterial Vaginitis (gardnerella): NEGATIVE
Candida Glabrata: NEGATIVE
Candida Vaginitis: POSITIVE — AB
Comment: NEGATIVE
Comment: NEGATIVE
Comment: NEGATIVE

## 2022-05-30 ENCOUNTER — Other Ambulatory Visit: Payer: Self-pay | Admitting: Obstetrics and Gynecology

## 2022-05-30 ENCOUNTER — Encounter: Payer: Self-pay | Admitting: Obstetrics and Gynecology

## 2022-05-30 DIAGNOSIS — B3731 Acute candidiasis of vulva and vagina: Secondary | ICD-10-CM

## 2022-05-30 MED ORDER — FLUCONAZOLE 150 MG PO TABS: 150.0000 mg | ORAL_TABLET | ORAL | 0 refills | Status: DC | PRN

## 2022-06-19 ENCOUNTER — Ambulatory Visit: Payer: Medicaid Other | Admitting: Obstetrics and Gynecology

## 2022-08-29 ENCOUNTER — Ambulatory Visit: Payer: Medicaid Other | Admitting: Dietician

## 2022-09-01 ENCOUNTER — Ambulatory Visit
Admission: RE | Admit: 2022-09-01 | Discharge: 2022-09-01 | Disposition: A | Discharge status: Home / Self Care | Payer: Medicaid Other | Source: Ambulatory Visit

## 2022-09-01 VITALS — BP 117/84 | HR 85 | Temp 97.8°F | Resp 18

## 2022-09-01 DIAGNOSIS — M25512 Pain in left shoulder: Secondary | ICD-10-CM

## 2022-09-01 HISTORY — DX: Unspecified asthma, uncomplicated: J45.909

## 2022-09-01 MED ORDER — METHOCARBAMOL 500 MG PO TABS: 500.0000 mg | ORAL_TABLET | Freq: Two times a day (BID) | ORAL | 0 refills | Status: DC | PRN

## 2022-09-01 NOTE — ED Triage Notes (Signed)
Patient to Urgent Care with complaints of left sided shoulder pain that radiates into her arm that started. 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   Denies any known injury. Has some nerve/ tingling pain she describes as a "zap". Hx of previous shoulder injury. Previous prescription of flexeril that helped her through the night. Changed her glucose monitor, some concerns about it being related.

## 2022-09-01 NOTE — ED Provider Notes (Signed)
Teresa Bennett    CSN: [id number] Arrival date & time: 09/01/22  1156      History   Chief Complaint Chief Complaint  Patient presents with   Arm Injury    No specific injury that I recall but having pain in shoulder and arm - Entered by patient    HPI Teresa Bennett is a 32 y.o. female.  Patient presents with 1 week history of pain in her left shoulder.  No falls or injury.  The pain radiates to her upper arm.  It is intermittent and feels like a muscle spasm at times.  Sometimes it is tingling and sometimes a sharp zap.  She had a continuous glucose monitor in her left upper arm and removed it but this did not improve her symptoms.  No wounds, redness, bruising, numbness, weakness, fever, chills, chest pain, shortness of breath, or other symptoms.  She took some leftover Flexeril from a previous illness but it not help with her discomfort.  Her medical history includes diabetes, asthma, GERD.    The history is provided by the patient and medical records.    Past Medical History:  Diagnosis Date   Asthma    Diabetes mellitus (HCC) 03/26/2021   GERD (gastroesophageal reflux disease)     Patient Active Problem List   Diagnosis Date Noted   Diabetes mellitus (HCC) 03/26/2021    Past Surgical History:  Procedure Laterality Date   CESAREAN SECTION      OB History     Gravida  3   Para  2   Term  2   Preterm      AB  1   Living  2      SAB  1   IAB      Ectopic      Multiple      Live Births  2            Home Medications    Prior to Admission medications   Medication Sig Start Date End Date Taking? Authorizing Provider  albuterol (VENTOLIN HFA) 108 (90 Base) MCG/ACT inhaler Inhale into the lungs. 11/22/21  Yes [provider]  Continuous Glucose Receiver (FREESTYLE LIBRE 2 READER) DEVI Use reader for glucose monitoring 07/02/22  Yes [provider]  metFORMIN (GLUCOPHAGE-XR) 500 MG 24 hr tablet Take by mouth. 06/14/22  06/14/23 Yes [provider]  methocarbamol (ROBAXIN) 500 MG tablet Take 1 tablet (500 mg total) by mouth 2 (two) times daily as needed for muscle spasms. 09/01/22  Yes Mickie Bail, NP  OZEMPIC, 0.25 OR 0.5 MG/DOSE, 2 MG/3ML SOPN INJECT 0.75 MLS (0.5 MG TOTAL) SUBCUTANEOUSLY ONCE A WEEK 08/29/22  Yes [provider]  baclofen (LIORESAL) 10 MG tablet Take 1 tablet (10 mg total) by mouth 3 (three) times daily. 10/23/21   Margaretann Loveless, PA-C  cetirizine (ZYRTEC) 10 MG tablet Take 10 mg by mouth daily.    [provider]  fluconazole (DIFLUCAN) 150 MG tablet Take 1 tablet (150 mg total) by mouth every 3 (three) days as needed. Patient not taking: Reported on 09/01/2022 05/30/22   Hildred Laser, MD  naproxen (NAPROSYN) 500 MG tablet Take 1 tablet (500 mg total) by mouth 2 (two) times daily with a meal. 10/23/21   Margaretann Loveless, PA-C    Family History Family History  Problem Relation Age of Onset   Hypertension Mother    Hypertension Father    Diabetes Father     Social  History Social History   Tobacco Use   Smoking status: Never   Smokeless tobacco: Never  Vaping Use   Vaping Use: Never used  Substance Use Topics   Alcohol use: Yes   Drug use: Never     Allergies   Cefixime, Latex, and Prevacid [lansoprazole]   Review of Systems Review of Systems  Constitutional:  Negative for chills and fever.  Respiratory:  Negative for cough and shortness of breath.   Cardiovascular:  Negative for chest pain and palpitations.  Musculoskeletal:  Positive for arthralgias. Negative for back pain and joint swelling.  Skin:  Negative for color change, rash and wound.  Neurological:  Negative for weakness and numbness.  All other systems reviewed and are negative.    Physical Exam Triage Vital Signs ED Triage Vitals  Enc Vitals Group     BP 09/01/22 1212 117/84     Pulse Rate 09/01/22 1208 85     Resp 09/01/22 1208 18     Temp 09/01/22 1208 97.8 F  (36.6 C)     Temp src --      SpO2 09/01/22 1208 97 %     Weight --      Height --      Head Circumference --      Peak Flow --      Pain Score 09/01/22 1207 5     Pain Loc --      Pain Edu? --      Excl. in GC? --    No data found.  Updated Vital Signs BP 117/84   Pulse 85   Temp 97.8 F (36.6 C)   Resp 18   LMP 08/18/2022   SpO2 97%   Visual Acuity Right Eye Distance:   Left Eye Distance:   Bilateral Distance:    Right Eye Near:   Left Eye Near:    Bilateral Near:     Physical Exam Vitals and nursing note reviewed.  Constitutional:      General: She is not in acute distress.    Appearance: She is well-developed. She is not ill-appearing.  HENT:     Head: Normocephalic and atraumatic.     Mouth/Throat:     Mouth: Mucous membranes are moist.  Cardiovascular:     Rate and Rhythm: Normal rate and regular rhythm.     Heart sounds: Normal heart sounds.  Pulmonary:     Effort: Pulmonary effort is normal. No respiratory distress.     Breath sounds: Normal breath sounds.  Musculoskeletal:        General: No swelling, tenderness, deformity or signs of injury. Normal range of motion.     Cervical back: Neck supple.     Comments: Muscle tightness in left upper back and shoulder but no focal tenderness.  FROM, strength 5/5, sensation intact, 2+ radial pulse.  No wounds, erythema, ecchymosis.    Skin:    General: Skin is warm and dry.     Capillary Refill: Capillary refill takes less than 2 seconds.     Findings: No bruising, erythema, lesion or rash.  Neurological:     General: No focal deficit present.     Mental Status: She is alert and oriented to person, place, and time.     Sensory: No sensory deficit.     Motor: No weakness.  Psychiatric:        Mood and Affect: Mood normal.        Behavior: Behavior normal.  UC Treatments / Results  Labs (all labs ordered are listed, but only abnormal results are displayed) Labs Reviewed - No data to  display  EKG   Radiology No results found.  Procedures Procedures (including critical care time)  Medications Ordered in UC Medications - No data to display  Initial Impression / Assessment and Plan / UC Course  I have reviewed the triage vital signs and the nursing notes.  Pertinent labs & imaging results that were available during my care of the patient were reviewed by me and considered in my medical decision making (see chart for details).    Left shoulder pain.  No falls or injury.  Patient has some muscle tightness but no point tenderness.  Afebrile and vital signs are stable.  Discussed symptomatic treatment with ibuprofen and methocarbamol.  Precautions for drowsiness with methocarbamol discussed.  Instructed patient to follow-up with her PCP or an orthopedist if her symptoms are not improving.  Contact information for on-call Ortho provided.  Education provided on shoulder pain.  Patient agrees to plan of care.  Final Clinical Impressions(s) / UC Diagnoses   Final diagnoses:  Acute pain of left shoulder     Discharge Instructions      Take ibuprofen as needed for discomfort.  Take the muscle relaxer as needed for muscle spasm; Do not drive, operate machinery, or drink alcohol with this medication as it can cause drowsiness.   Follow up with your primary care provider or an orthopedist if your symptoms are not improving.         ED Prescriptions     Medication Sig Dispense Auth. Provider   methocarbamol (ROBAXIN) 500 MG tablet Take 1 tablet (500 mg total) by mouth 2 (two) times daily as needed for muscle spasms. 10 tablet Mickie Bail, NP      I have reviewed the PDMP during this encounter.   Mickie Bail, NP 09/01/22 1235

## 2022-09-01 NOTE — Discharge Instructions (Addendum)
Take ibuprofen as needed for discomfort.  Take the muscle relaxer as needed for muscle spasm; Do not drive, operate machinery, or drink alcohol with this medication as it can cause drowsiness.   Follow up with your primary care provider or an orthopedist if your symptoms are not improving.     

## 2022-12-13 ENCOUNTER — Telehealth: Payer: Medicaid Other | Admitting: Physician Assistant

## 2022-12-13 DIAGNOSIS — K047 Periapical abscess without sinus: Secondary | ICD-10-CM

## 2022-12-13 MED ORDER — AMOXICILLIN-POT CLAVULANATE 875-125 MG PO TABS
1.0000 | ORAL_TABLET | Freq: Two times a day (BID) | ORAL | 0 refills | Status: DC
Start: 2022-12-13 — End: 2023-02-16

## 2022-12-13 NOTE — Progress Notes (Signed)

## 2023-01-29 ENCOUNTER — Telehealth: Payer: Commercial Managed Care - PPO | Admitting: Physician Assistant

## 2023-01-29 DIAGNOSIS — J040 Acute laryngitis: Secondary | ICD-10-CM | POA: Diagnosis not present

## 2023-01-29 DIAGNOSIS — J029 Acute pharyngitis, unspecified: Secondary | ICD-10-CM

## 2023-01-29 MED ORDER — PREDNISONE 20 MG PO TABS
40.0000 mg | ORAL_TABLET | Freq: Every day | ORAL | 0 refills | Status: DC
Start: 2023-01-29 — End: 2023-05-17

## 2023-01-29 NOTE — Progress Notes (Signed)
E-Visit for Sore Throat  We are sorry that you are not feeling well.  Here is how we plan to help!  Your symptoms indicate a likely viral infection (Pharyngitis).   Pharyngitis is inflammation in the back of the throat which can cause a sore throat, scratchiness and sometimes difficulty swallowing.   Pharyngitis is typically caused by a respiratory virus and will just run its course.  Please keep in mind that your symptoms could last up to 10 days.  For throat pain, we recommend over the counter oral pain relief medications such as acetaminophen or aspirin, or anti-inflammatory medications such as ibuprofen or naproxen sodium.  Topical treatments such as oral throat lozenges or sprays may be used as needed.  Avoid close contact with loved ones, especially the very young and elderly.  Remember to wash your hands thoroughly throughout the day as this is the number one way to prevent the spread of infection and wipe down door knobs and counters with disinfectant.  After careful review of your answers, I would not recommend an antibiotic for your condition.  Antibiotics should not be used to treat conditions that we suspect are caused by viruses like the virus that causes the common cold or flu. However, some people can have Strep with atypical symptoms. You may need formal testing in clinic or office to confirm if your symptoms continue or worsen.  Providers prescribe antibiotics to treat infections caused by bacteria. Antibiotics are very powerful in treating bacterial infections when they are used properly.  To maintain their effectiveness, they should be used only when necessary.  Overuse of antibiotics has resulted in the development of super bugs that are resistant to treatment!    I will prescribed Prednisone 20mg  Take 2 tablets (40mg ) daily for 5 days for the laryngitis symptoms. Monitor glucose closely.   Home Care: Only take medications as instructed by your medical team. Do not drink alcohol  while taking these medications. A steam or ultrasonic humidifier can help congestion.  You can place a towel over your head and breathe in the steam from hot water coming from a faucet. Avoid close contacts especially the very young and the elderly. Cover your mouth when you cough or sneeze. Always remember to wash your hands.  Get Help Right Away If: You develop worsening fever or throat pain. You develop a severe head ache or visual changes. Your symptoms persist after you have completed your treatment plan.  Make sure you Understand these instructions. Will watch your condition. Will get help right away if you are not doing well or get worse.   Thank you for choosing an e-visit.  Your e-visit answers were reviewed by a board certified advanced clinical practitioner to complete your personal care plan. Depending upon the condition, your plan could have included both over the counter or prescription medications.  Please review your pharmacy choice. Make sure the pharmacy is open so you can pick up prescription now. If there is a problem, you may contact your provider through [employer] and have the prescription routed to another pharmacy.  Your safety is important to [place]. If you have drug allergies check your prescription carefully.   For the next 24 hours you can use MyChart to ask questions about today's visit, request a non-urgent call back, or ask for a work or school excuse. You will get an email in the next two days asking about your experience. I hope that your e-visit has been valuable and will speed your  recovery.  I have spent 5 minutes in review of e-visit questionnaire, review and updating patient chart, medical decision making and response to patient.   Margaretann Loveless, PA-C

## 2023-02-16 ENCOUNTER — Ambulatory Visit
Admission: RE | Admit: 2023-02-16 | Discharge: 2023-02-16 | Disposition: A | Discharge status: Home / Self Care | Payer: Commercial Managed Care - PPO | Source: Ambulatory Visit

## 2023-02-16 VITALS — BP 123/86 | HR 90 | Temp 98.0°F | Resp 18

## 2023-02-16 DIAGNOSIS — J0100 Acute maxillary sinusitis, unspecified: Secondary | ICD-10-CM | POA: Diagnosis not present

## 2023-02-16 MED ORDER — AMOXICILLIN 875 MG PO TABS
875.0000 mg | ORAL_TABLET | Freq: Two times a day (BID) | ORAL | 0 refills | Status: AC
Start: 2023-02-16 — End: 2023-02-26

## 2023-02-16 NOTE — Discharge Instructions (Addendum)
Take the amoxicillin as directed.  Follow up with your primary care provider if your symptoms are not improving.   ° ° °

## 2023-02-16 NOTE — ED Triage Notes (Signed)
Patient to Urgent Care with complaints of cough and chest tightness/ congestion but unable to produce anything. Bilateral ear pain. Denies any fevers.  Symptoms started approx 2 weeks ago. Taking Mucinex and using an albuterol inhaler.

## 2023-02-16 NOTE — ED Provider Notes (Signed)
[name]    CSN: [id number] Arrival date & time: 02/16/23  1148      History   Chief Complaint Chief Complaint  Patient presents with   Cough   Nasal Congestion    HPI Teresa Bennett is a 32 y.o. female.  Patient presents with 2-week history of congestion, postnasal drip, sinus pressure, cough.  She has chest tightness when she has coughing episodes but none currently.  No fever, chest pain, shortness of breath, or other symptoms.  No OTC medications taken today.  Patient was seen at [facility] on 02/01/2023; diagnosed with viral URI and cough; treated with promethazine DM, albuterol inhaler, Tessalon Perles.  She had an e-visit on 01/29/2023; diagnosed with viral pharyngitis and laryngitis; treated with prednisone.  Her medical history includes diabetes.  She denies history of asthma.  The history is provided by medical records and the patient.    Past Medical History:  Diagnosis Date   Asthma    Diabetes mellitus (HCC) 03/26/2021   GERD (gastroesophageal reflux disease)     Patient Active Problem List   Diagnosis Date Noted   Diabetes mellitus (HCC) 03/26/2021    Past Surgical History:  Procedure Laterality Date   CESAREAN SECTION      OB History     Gravida  3   Para  2   Term  2   Preterm      AB  1   Living  2      SAB  1   IAB      Ectopic      Multiple      Live Births  2            Home Medications    Prior to Admission medications   Medication Sig Start Date End Date Taking? Authorizing Provider  amoxicillin (AMOXIL) 875 MG tablet Take 1 tablet (875 mg total) by mouth 2 (two) times daily for 10 days. 02/16/23 02/26/23 Yes Mickie Bail, NP  benzonatate (TESSALON) 200 MG capsule Take by mouth. 02/01/23  Yes [provider]  albuterol (VENTOLIN HFA) 108 (90 Base) MCG/ACT inhaler Inhale into the lungs. 11/22/21   [provider]  cetirizine (ZYRTEC) 10 MG tablet Take 10 mg by mouth daily.    [provider]  Continuous Glucose Receiver (FREESTYLE LIBRE 2 READER) DEVI Use reader for glucose monitoring 07/02/22   [provider]  fluconazole (DIFLUCAN) 150 MG tablet Take 1 tablet (150 mg total) by mouth every 3 (three) days as needed. Patient not taking: Reported on 09/01/2022 05/30/22   Hildred Laser, MD  metFORMIN (GLUCOPHAGE-XR) 500 MG 24 hr tablet Take by mouth. 06/14/22 06/14/23  [provider]  methocarbamol (ROBAXIN) 500 MG tablet Take 1 tablet (500 mg total) by mouth 2 (two) times daily as needed for muscle spasms. 09/01/22   Mickie Bail, NP  OZEMPIC, 0.25 OR 0.5 MG/DOSE, 2 MG/3ML SOPN INJECT 0.75 MLS (0.5 MG TOTAL) SUBCUTANEOUSLY ONCE A WEEK 08/29/22   [provider]  predniSONE (DELTASONE) 20 MG tablet Take 2 tablets (40 mg total) by mouth daily with breakfast. Patient not taking: Reported on 02/16/2023 01/29/23   Margaretann Loveless, PA-C    Family History Family History  Problem Relation Age of Onset   Hypertension Mother    Hypertension Father    Diabetes Father     Social History Social History   Tobacco Use   Smoking status: Never   Smokeless tobacco: Never  Vaping Use   Vaping status: Never Used  Substance Use Topics   Alcohol use: Yes   Drug use: Never     Allergies   Peanut (diagnostic), Cefixime, Latex, and Prevacid [lansoprazole]   Review of Systems Review of Systems  Constitutional:  Negative for chills and fever.  HENT:  Positive for congestion, postnasal drip, rhinorrhea, sinus pressure and voice change. Negative for ear pain and sore throat.   Respiratory:  Positive for cough. Negative for shortness of breath.   Cardiovascular:  Negative for chest pain and palpitations.     Physical Exam Triage Vital Signs ED Triage Vitals  Encounter Vitals Group     BP 02/16/23 1200 123/86     Systolic BP Percentile --      Diastolic BP Percentile --      Pulse Rate 02/16/23 1200 90     Resp 02/16/23 1200 18     Temp  02/16/23 1200 98 F (36.7 C)     Temp src --      SpO2 02/16/23 1200 97 %     Weight --      Height --      Head Circumference --      Peak Flow --      Pain Score 02/16/23 1210 4     Pain Loc --      Pain Education --      Exclude from Growth Chart --    No data found.  Updated Vital Signs BP 123/86   Pulse 90   Temp 98 F (36.7 C)   Resp 18   LMP 02/02/2023   SpO2 97%   Visual Acuity Right Eye Distance:   Left Eye Distance:   Bilateral Distance:    Right Eye Near:   Left Eye Near:    Bilateral Near:     Physical Exam Constitutional:      General: She is not in acute distress. HENT:     Right Ear: Tympanic membrane normal.     Left Ear: Tympanic membrane normal.     Nose: Congestion and rhinorrhea present.     Mouth/Throat:     Mouth: Mucous membranes are moist.     Pharynx: Oropharynx is clear.  Cardiovascular:     Rate and Rhythm: Normal rate and regular rhythm.     Heart sounds: Normal heart sounds.  Pulmonary:     Effort: Pulmonary effort is normal. No respiratory distress.     Breath sounds: Normal breath sounds. No wheezing.  Skin:    General: Skin is warm and dry.  Neurological:     Mental Status: She is alert.      UC Treatments / Results  Labs (all labs ordered are listed, but only abnormal results are displayed) Labs Reviewed - No data to display  EKG   Radiology No results found.  Procedures Procedures (including critical care time)  Medications Ordered in UC Medications - No data to display  Initial Impression / Assessment and Plan / UC Course  I have reviewed the triage vital signs and the nursing notes.  Pertinent labs & imaging results that were available during my care of the patient were reviewed by me and considered in my medical decision making (see chart for details).    Acute sinusitis.  Patient has been symptomatic for 2 weeks and is not improving with various treatments.  Treating today with amoxicillin.  Tylenol  or ibuprofen as needed.  Plain Mucinex as needed.  Education provided  on sinus infection.  Instructed patient to follow up with her PCP if her symptoms are not improving.  She agrees to plan of care.   Final Clinical Impressions(s) / UC Diagnoses   Final diagnoses:  Acute non-recurrent maxillary sinusitis     Discharge Instructions      Take the amoxicillin as directed.  Follow-up with your primary care provider if your symptoms are not improving.      ED Prescriptions     Medication Sig Dispense Auth. Provider   amoxicillin (AMOXIL) 875 MG tablet Take 1 tablet (875 mg total) by mouth 2 (two) times daily for 10 days. 20 tablet Mickie Bail, NP      PDMP not reviewed this encounter.   Mickie Bail, NP 02/16/23 1247

## 2023-04-16 ENCOUNTER — Telehealth: Payer: Commercial Managed Care - PPO | Admitting: Family Medicine

## 2023-04-16 DIAGNOSIS — K0889 Other specified disorders of teeth and supporting structures: Secondary | ICD-10-CM | POA: Diagnosis not present

## 2023-04-16 MED ORDER — CLINDAMYCIN HCL 300 MG PO CAPS: 300.0000 mg | ORAL_CAPSULE | Freq: Three times a day (TID) | ORAL | 0 refills | Status: AC

## 2023-04-16 NOTE — Progress Notes (Signed)

## 2023-05-08 ENCOUNTER — Emergency Department
Admission: EM | Admit: 2023-05-08 | Discharge: 2023-05-08 | Disposition: A | Discharge status: Home / Self Care | Payer: Commercial Managed Care - PPO | Attending: Emergency Medicine | Admitting: Emergency Medicine

## 2023-05-08 ENCOUNTER — Other Ambulatory Visit: Payer: Self-pay

## 2023-05-08 ENCOUNTER — Emergency Department: Payer: Commercial Managed Care - PPO

## 2023-05-08 DIAGNOSIS — E119 Type 2 diabetes mellitus without complications: Secondary | ICD-10-CM | POA: Insufficient documentation

## 2023-05-08 DIAGNOSIS — R0789 Other chest pain: Secondary | ICD-10-CM | POA: Diagnosis present

## 2023-05-08 DIAGNOSIS — Z7984 Long term (current) use of oral hypoglycemic drugs: Secondary | ICD-10-CM | POA: Insufficient documentation

## 2023-05-08 DIAGNOSIS — R079 Chest pain, unspecified: Secondary | ICD-10-CM

## 2023-05-08 DIAGNOSIS — R110 Nausea: Secondary | ICD-10-CM | POA: Diagnosis not present

## 2023-05-08 LAB — BASIC METABOLIC PANEL
Anion gap: 8 (ref 5–15)
BUN: 15 mg/dL (ref 6–20)
CO2: 23 mmol/L (ref 22–32)
Calcium: 9 mg/dL (ref 8.9–10.3)
Chloride: 106 mmol/L (ref 98–111)
Creatinine, Ser: 0.75 mg/dL (ref 0.44–1.00)
GFR, Estimated: >60 mL/min (ref >60)
Glucose, Bld: 119 mg/dL — ABNORMAL HIGH (ref 70–99)
Potassium: 3.5 mmol/L (ref 3.5–5.1)
Sodium: 137 mmol/L (ref 135–145)

## 2023-05-08 LAB — TROPONIN I (HIGH SENSITIVITY)
Troponin I (High Sensitivity): 3 ng/L (ref <18)
Troponin I (High Sensitivity): <2 ng/L (ref <18)

## 2023-05-08 LAB — CBC
HCT: 39.6 % (ref 36.0–46.0)
Hemoglobin: 13.8 g/dL (ref 12.0–15.0)
MCH: 29.7 pg (ref 26.0–34.0)
MCHC: 34.8 g/dL (ref 30.0–36.0)
MCV: 85.3 fL (ref 80.0–100.0)
Platelets: 156 10*3/uL (ref 150–400)
RBC: 4.64 MIL/uL (ref 3.87–5.11)
RDW: 12.8 % (ref 11.5–15.5)
WBC: 7.2 10*3/uL (ref 4.0–10.5)
nRBC: 0 % (ref 0.0–0.2)

## 2023-05-08 LAB — HCG, QUANTITATIVE, PREGNANCY: hCG, Beta Chain, Quant, S: <1 m[IU]/mL (ref <5)

## 2023-05-08 LAB — HEPATIC FUNCTION PANEL
ALT: 38 U/L (ref 0–44)
AST: 25 U/L (ref 15–41)
Albumin: 3.9 g/dL (ref 3.5–5.0)
Alkaline Phosphatase: 30 U/L — ABNORMAL LOW (ref 38–126)
Bilirubin, Direct: 0.2 mg/dL (ref 0.0–0.2)
Indirect Bilirubin: 0.8 mg/dL (ref 0.3–0.9)
Total Bilirubin: 1 mg/dL (ref 0.0–1.2)
Total Protein: 6.9 g/dL (ref 6.5–8.1)

## 2023-05-08 LAB — LIPASE, BLOOD: Lipase: 27 U/L (ref 11–51)

## 2023-05-08 LAB — POC URINE PREG, ED: Preg Test, Ur: NEGATIVE

## 2023-05-08 MED ORDER — ONDANSETRON 4 MG PO TBDP
4.0000 mg | ORAL_TABLET | Freq: Once | ORAL | Status: AC
  Administered 2023-05-08: 4 mg via ORAL
  Filled 2023-05-08: qty 1

## 2023-05-08 NOTE — Discharge Instructions (Signed)
 Please return if you have persistent worsening symptoms, chest pain, shortness of breath, lightheadedness, if you pass out, or if you have any additional concerns.

## 2023-05-08 NOTE — ED Provider Notes (Signed)
 [facility] Provider Note    Event Date/Time   First MD Initiated Contact with Patient 05/08/23 1354     (approximate)   History   Chest Pain   HPI  Teresa Bennett is a 33 y.o. female with history of diabetes presenting with chest pain that started around 8:00 today.  States that it started in her epigastric region, and then migrated up to her left chest.  It is pressure, otherwise nonradiating.  Says yes she has some nausea.  But no vomiting.  No shortness of breath or cough, no URI symptoms.  No diarrhea.  No urinary symptoms.  No leg swelling.  Does not take oral contraceptive pills, no history of blood clots, no recent travel or surgeries, no history of malignancies.  On independent chart review she was seen by primary care doctor in September of last year, A1c was 10.5, is on Ozempic and metformin.     Physical Exam   Triage Vital Signs: ED Triage Vitals  Encounter Vitals Group     BP 05/08/23 1101 104/72     Systolic BP Percentile --      Diastolic BP Percentile --      Pulse Rate 05/08/23 1101 (!) 117     Resp 05/08/23 1101 18     Temp 05/08/23 1101 97.9 F (36.6 C)     Temp Source 05/08/23 1101 Oral     SpO2 05/08/23 1101 98 %     Weight 05/08/23 1054 210 lb 12.2 oz (95.6 kg)     Height 05/08/23 1054 5' 10 (1.778 m)     Head Circumference --      Peak Flow --      Pain Score 05/08/23 1054 5     Pain Loc --      Pain Education --      Exclude from Growth Chart --     Most recent vital signs: Vitals:   05/08/23 1101 05/08/23 1512  BP: 104/72 124/75  Pulse: (!) 117 83  Resp: 18 18  Temp: 97.9 F (36.6 C) 97.9 F (36.6 C)  SpO2: 98% 97%     General: Awake, no distress.  CV:  Good peripheral perfusion.  Mild reproducible tenderness to her left anterior thoracic cage Resp:  Normal effort.  Clear bilaterally Abd:  No distention.  Soft nontender Other:  No unilateral calf swelling or tenderness, no lower extremity  edema.   ED Results / Procedures / Treatments   Labs (all labs ordered are listed, but only abnormal results are displayed) Labs Reviewed  BASIC METABOLIC PANEL - Abnormal; Notable for the following components:      Result Value   Glucose, Bld 119 (*)    All other components within normal limits  HEPATIC FUNCTION PANEL - Abnormal; Notable for the following components:   Alkaline Phosphatase 30 (*)    All other components within normal limits  CBC  LIPASE, BLOOD  HCG, QUANTITATIVE, PREGNANCY  POC URINE PREG, ED  TROPONIN I (HIGH SENSITIVITY)  TROPONIN I (HIGH SENSITIVITY)     EKG  Normal sinus rhythm, rate 100, normal QRS, normal QTc, T wave flattening in aVL, no ischemic ST elevation, no prior to compare   RADIOLOGY Chest x-ray on my interpretation without focal consolidation   PROCEDURES:  Critical Care performed: No  Procedures   MEDICATIONS ORDERED IN ED: Medications  ondansetron  (ZOFRAN -ODT) disintegrating tablet 4 mg (4 mg Oral Given 05/08/23 1511)     IMPRESSION /  MDM / ASSESSMENT AND PLAN / ED COURSE  I reviewed the triage vital signs and the nursing notes.                              Differential diagnosis includes, but is not limited to, ACS, costochondritis, musculoskeletal pain, strain, considered PE with patient has no other risk factors for it, this is less likely.  No leg swelling or shortness of breath to suggest new onset heart failure.  Considered GERD, gastritis, pancreatitis, medication side effect, no right upper quadrant abdominal pain to suggest biliary etiology.  Offered GI cocktail but patient states that she took Tums earlier and declined.  Will give her some Zofran .  Will get labs, EKG, troponin, chest x-ray.  Patient's presentation is most consistent with acute presentation with potential threat to life or bodily function.  Independent review and interpretation of labs and imaging, troponin is negative, electrolytes not severely  deranged, creatinine is normal, no leukocytosis.  Her EKG is nonischemic and her chest x-ray does not have any focal findings.  Heart score is 1.  Considered but no indication for additional workup or inpatient admission at this time, she is safe for outpatient management.  Instructed her to follow-up with her primary care doctor in 3 to 4 days for reassessment.  Strict turn precautions given.  Discharge.      FINAL CLINICAL IMPRESSION(S) / ED DIAGNOSES   Final diagnoses:  Nonspecific chest pain  Nausea     Rx / DC Orders   ED Discharge Orders     None        Note:  This document was prepared using Dragon voice recognition software and may include unintentional dictation errors.    Waymond Lorelle Cummins, MD 05/09/23 [phone]

## 2023-05-08 NOTE — ED Triage Notes (Signed)
 Pt here with cp that started this morning. Pt states the pain is centered and intermittently radiates to her left arm. Pt describes the pain as tightness. Pt was nauseous this morning. Pt denies cardiac hx.

## 2023-05-17 ENCOUNTER — Telehealth: Payer: Commercial Managed Care - PPO | Admitting: Physician Assistant

## 2023-05-17 DIAGNOSIS — H6992 Unspecified Eustachian tube disorder, left ear: Secondary | ICD-10-CM

## 2023-05-17 MED ORDER — FLUTICASONE PROPIONATE 50 MCG/ACT NA SUSP
2.0000 | Freq: Every day | NASAL | 0 refills | Status: AC
Start: 2023-05-17 — End: ?

## 2023-05-17 NOTE — Progress Notes (Signed)
get an email with a survey after your eVisit asking about your experience. We would appreciate your feedback. I hope that your e-visit has been valuable and will aid in your recovery.     I have spent 5 minutes in  review of e-visit questionnaire, review and updating patient chart, medical decision making and response to patient.   Margaretann Loveless, PA-C

## 2023-05-21 ENCOUNTER — Telehealth: Payer: Commercial Managed Care - PPO | Admitting: Physician Assistant

## 2023-05-21 DIAGNOSIS — H6502 Acute serous otitis media, left ear: Secondary | ICD-10-CM

## 2023-05-21 MED ORDER — AMOXICILLIN 875 MG PO TABS: 875.0000 mg | ORAL_TABLET | Freq: Two times a day (BID) | ORAL | 0 refills | Status: AC

## 2023-05-21 NOTE — Progress Notes (Signed)

## 2024-01-21 ENCOUNTER — Telehealth: Admitting: Emergency Medicine

## 2024-01-21 DIAGNOSIS — M549 Dorsalgia, unspecified: Secondary | ICD-10-CM

## 2024-01-21 MED ORDER — NAPROXEN 500 MG PO TABS: 500.0000 mg | ORAL_TABLET | Freq: Two times a day (BID) | ORAL | 0 refills | Status: DC

## 2024-01-21 MED ORDER — CYCLOBENZAPRINE HCL 10 MG PO TABS: 10.0000 mg | ORAL_TABLET | Freq: Three times a day (TID) | ORAL | 0 refills | Status: AC | PRN

## 2024-01-21 MED ORDER — NAPROXEN 500 MG PO TABS
500.0000 mg | ORAL_TABLET | Freq: Two times a day (BID) | ORAL | 0 refills | Status: AC
Start: 2024-01-21 — End: ?

## 2024-01-21 NOTE — Progress Notes (Signed)

## 2024-01-21 NOTE — Addendum Note (Signed)
 Addended by: Deanza Upperman M on: 01/21/2024 04:02 PM   Modules accepted: Orders

## 2024-01-26 ENCOUNTER — Telehealth: Admitting: Nurse Practitioner

## 2024-01-26 DIAGNOSIS — K009 Disorder of tooth development, unspecified: Secondary | ICD-10-CM

## 2024-01-26 MED ORDER — CLINDAMYCIN HCL 300 MG PO CAPS: 300.0000 mg | ORAL_CAPSULE | Freq: Three times a day (TID) | ORAL | 0 refills | Status: AC

## 2024-01-26 NOTE — Progress Notes (Signed)
 E-Visit for Dental Pain  We are sorry that you are not feeling well.  Here is how we plan to help!  Based on what you have shared with me in the questionnaire, it sounds like you may have a dental infection  Clindamycin  300mg  3 times a day for 7 days  It is imperative that you see a dentist within 10 days of this eVisit to determine the cause of the dental pain and be sure it is adequately treated  A toothache or tooth pain is caused when the nerve in the root of a tooth or surrounding a tooth is irritated. Dental (tooth) infection, decay, injury, or loss of a tooth are the most common causes of dental pain. Pain may also occur after an extraction (tooth is pulled out). Pain sometimes originates from other areas and radiates to the jaw, thus appearing to be tooth pain.Bacteria growing inside your mouth can contribute to gum disease and dental decay, both of which can cause pain. A toothache occurs from inflammation of the central portion of the tooth called pulp. The pulp contains nerve endings that are very sensitive to pain. Inflammation to the pulp or pulpitis may be caused by dental cavities, trauma, and infection.    HOME CARE:   For toothaches: Over-the-counter pain medications such as acetaminophen or ibuprofen may be used. Take these as directed on the package while you arrange for a dental appointment. Avoid very cold or hot foods, because they may make the pain worse. You may get relief from biting on a cotton ball soaked in oil of cloves. You can get oil of cloves at most drug stores.  For jaw pain:  Aspirin may be helpful for problems in the joint of the jaw in adults. If pain happens every time you open your mouth widely, the temporomandibular joint (TMJ) may be the source of the pain. Yawning or taking a large bite of food may worsen the pain. An appointment with your doctor or dentist will help you find the cause.     GET HELP RIGHT AWAY IF:  You have a high fever or  chills If you have had a recent head or face injury and develop headache, light headedness, nausea, vomiting, or other symptoms that concern you after an injury to your face or mouth, you could have a more serious injury in addition to your dental injury. A facial rash associated with a toothache: This condition may improve with medication. Contact your doctor for them to decide what is appropriate. Any jaw pain occurring with chest pain: Although jaw pain is most commonly caused by dental disease, it is sometimes referred pain from other areas. People with heart disease, especially people who have had stents placed, people with diabetes, or those who have had heart surgery may have jaw pain as a symptom of heart attack or angina. If your jaw or tooth pain is associated with lightheadedness, sweating, or shortness of breath, you should see a doctor as soon as possible. Trouble swallowing or excessive pain or bleeding from gums: If you have a history of a weakened immune system, diabetes, or steroid use, you may be more susceptible to infections. Infections can often be more severe and extensive or caused by unusual organisms. Dental and gum infections in people with these conditions may require more aggressive treatment. An abscess may need draining or IV antibiotics, for example.  MAKE SURE YOU   Understand these instructions. Will watch your condition. Will get help right away if  you are not doing well or get worse.  Thank you for choosing an e-visit.  Your e-visit answers were reviewed by a board certified advanced clinical practitioner to complete your personal care plan. Depending upon the condition, your plan could have included both over the counter or prescription medications.  Please review your pharmacy choice. Make sure the pharmacy is open so you can pick up prescription now. If there is a problem, you may contact your provider through [employer] and have the prescription routed to  another pharmacy.  Your safety is important to us . If you have drug allergies check your prescription carefully.   For the next 24 hours you can use MyChart to ask questions about today's visit, request a non-urgent call back, or ask for a work or school excuse. You will get an email in the next two days asking about your experience. I hope that your e-visit has been valuable and will speed your recovery.  I have spent 5 minutes in review of e-visit questionnaire, review and updating patient chart, medical decision making and response to patient.   Angi Goodell W Erika Slaby, NP
